# Patient Record
Sex: Female | Born: 1970 | Race: Black or African American | Hispanic: No | Smoking: Never smoker
Health system: Southern US, Community
[De-identification: ages and names within clinical notes are randomized; demographics above are authoritative.]

## PROBLEM LIST (undated history)

## (undated) DIAGNOSIS — O1490 Unspecified pre-eclampsia, unspecified trimester: Secondary | ICD-10-CM

## (undated) DIAGNOSIS — I10 Essential (primary) hypertension: Secondary | ICD-10-CM

## (undated) DIAGNOSIS — Z8632 Personal history of gestational diabetes: Secondary | ICD-10-CM

## (undated) DIAGNOSIS — E119 Type 2 diabetes mellitus without complications: Secondary | ICD-10-CM

## (undated) DIAGNOSIS — O24419 Gestational diabetes mellitus in pregnancy, unspecified control: Secondary | ICD-10-CM

## (undated) HISTORY — DX: Personal history of gestational diabetes: Z86.32

## (undated) HISTORY — DX: Type 2 diabetes mellitus without complications: E11.9

## (undated) HISTORY — DX: Unspecified pre-eclampsia, unspecified trimester: O14.90

## (undated) HISTORY — PX: TUBAL LIGATION: SHX77

## (undated) HISTORY — DX: Gestational diabetes mellitus in pregnancy, unspecified control: O24.419

---

## 1997-11-15 ENCOUNTER — Emergency Department (HOSPITAL_COMMUNITY): Admission: EM | Admit: 1997-11-15 | Discharge: 1997-11-15 | Payer: Self-pay

## 1998-11-24 ENCOUNTER — Emergency Department (HOSPITAL_COMMUNITY): Admission: EM | Admit: 1998-11-24 | Discharge: 1998-11-24 | Payer: Self-pay | Admitting: Emergency Medicine

## 1999-02-01 ENCOUNTER — Inpatient Hospital Stay (HOSPITAL_COMMUNITY): Admission: AD | Admit: 1999-02-01 | Discharge: 1999-02-01 | Payer: Self-pay | Admitting: Obstetrics

## 1999-04-04 ENCOUNTER — Inpatient Hospital Stay (HOSPITAL_COMMUNITY): Admission: AD | Admit: 1999-04-04 | Discharge: 1999-04-04 | Payer: Self-pay | Admitting: Obstetrics

## 1999-07-24 ENCOUNTER — Inpatient Hospital Stay (HOSPITAL_COMMUNITY): Admission: AD | Admit: 1999-07-24 | Discharge: 1999-07-24 | Payer: Self-pay | Admitting: Obstetrics

## 1999-09-02 ENCOUNTER — Inpatient Hospital Stay (HOSPITAL_COMMUNITY): Admission: AD | Admit: 1999-09-02 | Discharge: 1999-09-04 | Payer: Self-pay | Admitting: Obstetrics

## 1999-11-30 ENCOUNTER — Emergency Department (HOSPITAL_COMMUNITY): Admission: EM | Admit: 1999-11-30 | Discharge: 1999-11-30 | Payer: Self-pay | Admitting: *Deleted

## 2001-03-28 ENCOUNTER — Ambulatory Visit (HOSPITAL_COMMUNITY): Admission: RE | Admit: 2001-03-28 | Discharge: 2001-03-28 | Payer: Self-pay | Admitting: *Deleted

## 2001-08-14 ENCOUNTER — Emergency Department (HOSPITAL_COMMUNITY): Admission: EM | Admit: 2001-08-14 | Discharge: 2001-08-14 | Payer: Self-pay

## 2001-12-26 ENCOUNTER — Other Ambulatory Visit: Admission: RE | Admit: 2001-12-26 | Discharge: 2001-12-26 | Payer: Self-pay | Admitting: Obstetrics and Gynecology

## 2002-10-15 ENCOUNTER — Other Ambulatory Visit: Admission: RE | Admit: 2002-10-15 | Discharge: 2002-10-15 | Payer: Self-pay | Admitting: Obstetrics and Gynecology

## 2002-12-07 ENCOUNTER — Ambulatory Visit (HOSPITAL_COMMUNITY): Admission: RE | Admit: 2002-12-07 | Discharge: 2002-12-07 | Payer: Self-pay | Admitting: Obstetrics and Gynecology

## 2002-12-07 ENCOUNTER — Encounter: Payer: Self-pay | Admitting: Obstetrics and Gynecology

## 2003-01-05 ENCOUNTER — Ambulatory Visit (HOSPITAL_COMMUNITY): Admission: RE | Admit: 2003-01-05 | Discharge: 2003-01-05 | Payer: Self-pay | Admitting: Obstetrics and Gynecology

## 2003-01-05 ENCOUNTER — Encounter: Payer: Self-pay | Admitting: Obstetrics and Gynecology

## 2003-03-30 ENCOUNTER — Ambulatory Visit (HOSPITAL_COMMUNITY): Admission: RE | Admit: 2003-03-30 | Discharge: 2003-03-30 | Payer: Self-pay | Admitting: Obstetrics and Gynecology

## 2003-04-13 ENCOUNTER — Encounter: Admission: RE | Admit: 2003-04-13 | Discharge: 2003-04-13 | Payer: Self-pay | Admitting: Obstetrics and Gynecology

## 2003-04-19 ENCOUNTER — Inpatient Hospital Stay (HOSPITAL_COMMUNITY): Admission: AD | Admit: 2003-04-19 | Discharge: 2003-04-19 | Payer: Self-pay | Admitting: Obstetrics and Gynecology

## 2003-04-22 ENCOUNTER — Encounter: Admission: RE | Admit: 2003-04-22 | Discharge: 2003-04-22 | Payer: Self-pay | Admitting: Obstetrics and Gynecology

## 2003-04-23 ENCOUNTER — Inpatient Hospital Stay (HOSPITAL_COMMUNITY): Admission: RE | Admit: 2003-04-23 | Discharge: 2003-04-27 | Payer: Self-pay | Admitting: Obstetrics and Gynecology

## 2003-12-28 ENCOUNTER — Other Ambulatory Visit: Admission: RE | Admit: 2003-12-28 | Discharge: 2003-12-28 | Payer: Self-pay | Admitting: Obstetrics and Gynecology

## 2004-01-24 ENCOUNTER — Ambulatory Visit: Payer: Self-pay | Admitting: Family Medicine

## 2004-05-10 ENCOUNTER — Emergency Department (HOSPITAL_COMMUNITY): Admission: EM | Admit: 2004-05-10 | Discharge: 2004-05-10 | Payer: Self-pay | Admitting: Emergency Medicine

## 2005-03-12 ENCOUNTER — Emergency Department (HOSPITAL_COMMUNITY): Admission: EM | Admit: 2005-03-12 | Discharge: 2005-03-12 | Payer: Self-pay | Admitting: *Deleted

## 2005-12-14 ENCOUNTER — Other Ambulatory Visit: Admission: RE | Admit: 2005-12-14 | Discharge: 2005-12-14 | Payer: Self-pay | Admitting: Obstetrics and Gynecology

## 2012-10-14 ENCOUNTER — Emergency Department (HOSPITAL_COMMUNITY): Payer: Self-pay

## 2012-10-14 ENCOUNTER — Emergency Department (HOSPITAL_COMMUNITY)
Admission: EM | Admit: 2012-10-14 | Discharge: 2012-10-14 | Disposition: A | Discharge status: Home / Self Care | Payer: Self-pay | Attending: Emergency Medicine | Admitting: Emergency Medicine

## 2012-10-14 ENCOUNTER — Encounter (HOSPITAL_COMMUNITY): Payer: Self-pay | Admitting: Emergency Medicine

## 2012-10-14 DIAGNOSIS — I10 Essential (primary) hypertension: Secondary | ICD-10-CM | POA: Insufficient documentation

## 2012-10-14 DIAGNOSIS — Z79899 Other long term (current) drug therapy: Secondary | ICD-10-CM | POA: Insufficient documentation

## 2012-10-14 DIAGNOSIS — R5383 Other fatigue: Secondary | ICD-10-CM | POA: Insufficient documentation

## 2012-10-14 DIAGNOSIS — R42 Dizziness and giddiness: Secondary | ICD-10-CM | POA: Insufficient documentation

## 2012-10-14 DIAGNOSIS — R5381 Other malaise: Secondary | ICD-10-CM | POA: Insufficient documentation

## 2012-10-14 DIAGNOSIS — E876 Hypokalemia: Secondary | ICD-10-CM | POA: Insufficient documentation

## 2012-10-14 HISTORY — DX: Essential (primary) hypertension: I10

## 2012-10-14 LAB — CBC WITH DIFFERENTIAL/PLATELET
Basophils Absolute: 0 10*3/uL (ref 0.0–0.1)
Basophils Relative: 1 % (ref 0–1)
Eosinophils Absolute: 0.2 10*3/uL (ref 0.0–0.7)
Eosinophils Relative: 4 % (ref 0–5)
HCT: 39.6 % (ref 36.0–46.0)
Hemoglobin: 13.4 g/dL (ref 12.0–15.0)
Lymphocytes Relative: 38 % (ref 12–46)
Lymphs Abs: 2.4 10*3/uL (ref 0.7–4.0)
MCH: 27.2 pg (ref 26.0–34.0)
MCHC: 33.8 g/dL (ref 30.0–36.0)
MCV: 80.3 fL (ref 78.0–100.0)
Monocytes Absolute: 0.4 10*3/uL (ref 0.1–1.0)
Monocytes Relative: 7 % (ref 3–12)
Neutro Abs: 3.2 10*3/uL (ref 1.7–7.7)
Neutrophils Relative %: 51 % (ref 43–77)
Platelets: 361 10*3/uL (ref 150–400)
RBC: 4.93 MIL/uL (ref 3.87–5.11)
RDW: 13.2 % (ref 11.5–15.5)
WBC: 6.2 10*3/uL (ref 4.0–10.5)

## 2012-10-14 LAB — COMPREHENSIVE METABOLIC PANEL
ALT: 16 U/L (ref 0–35)
AST: 16 U/L (ref 0–37)
Albumin: 4 g/dL (ref 3.5–5.2)
Alkaline Phosphatase: 52 U/L (ref 39–117)
BUN: 10 mg/dL (ref 6–23)
CO2: 26 mEq/L (ref 19–32)
Calcium: 9.5 mg/dL (ref 8.4–10.5)
Chloride: 100 mEq/L (ref 96–112)
Creatinine, Ser: 0.58 mg/dL (ref 0.50–1.10)
GFR calc Af Amer: >90 mL/min (ref >90)
GFR calc non Af Amer: >90 mL/min (ref >90)
Glucose, Bld: 110 mg/dL — ABNORMAL HIGH (ref 70–99)
Potassium: 3 mEq/L — ABNORMAL LOW (ref 3.5–5.1)
Sodium: 136 mEq/L (ref 135–145)
Total Bilirubin: 0.3 mg/dL (ref 0.3–1.2)
Total Protein: 8.1 g/dL (ref 6.0–8.3)

## 2012-10-14 LAB — POCT I-STAT TROPONIN I: Troponin i, poc: 0.01 ng/mL (ref 0.00–0.08)

## 2012-10-14 MED ORDER — POTASSIUM CHLORIDE ER 10 MEQ PO TBCR: EXTENDED_RELEASE_TABLET | ORAL | Status: DC

## 2012-10-14 NOTE — ED Provider Notes (Signed)
History     CSN: [id number]  Arrival date & time 10/14/12  4098   First MD Initiated Contact with Patient 10/14/12 [phone]      Chief Complaint  Patient presents with  . Chest Pain  . Dizziness    (Consider location/radiation/quality/duration/timing/severity/associated sxs/prior treatment) Patient is a 42 y.o. female presenting with weakness. The history is provided by the patient (the pt complain of weakness). No language interpreter was used.  Weakness This is a new problem. The current episode started more than 1 week ago. The problem occurs constantly. The problem has not changed since onset.Pertinent negatives include no chest pain, no abdominal pain and no headaches. Nothing aggravates the symptoms. Nothing relieves the symptoms.    Past Medical History  Diagnosis Date  . Hypertension     Past Surgical History  Procedure Laterality Date  . Cesarean section    . Tubal ligation      No family history on file.  History  Substance Use Topics  . Smoking status: Never Smoker   . Smokeless tobacco: Not on file  . Alcohol Use: No    OB History   Grav Para Term Preterm Abortions TAB SAB Ect Mult Living                  Review of Systems  Constitutional: Negative for appetite change and fatigue.  HENT: Negative for congestion, sinus pressure and ear discharge.   Eyes: Negative for discharge.  Respiratory: Negative for cough.   Cardiovascular: Negative for chest pain.  Gastrointestinal: Negative for abdominal pain and diarrhea.  Genitourinary: Negative for frequency and hematuria.  Musculoskeletal: Negative for back pain.  Skin: Negative for rash.  Neurological: Positive for weakness. Negative for seizures and headaches.  Psychiatric/Behavioral: Negative for hallucinations.    Allergies  Review of patient's allergies indicates no known allergies.  Home Medications   Current Outpatient Rx  Name  Route  Sig  Dispense  Refill  . amLODipine (NORVASC) 10 MG  tablet   Oral   Take 10 mg by mouth every morning.         . hydrochlorothiazide (HYDRODIURIL) 25 MG tablet   Oral   Take 25 mg by mouth every morning.         . potassium chloride (K-DUR) 10 MEQ tablet      Take one tablet daily   15 tablet   0     BP 140/92  Pulse 104  Temp(Src) 98.4 F (36.9 C) (Oral)  Resp 22  SpO2 97%  LMP 10/01/2012  Physical Exam  Constitutional: She is oriented to person, place, and time. She appears well-developed.  HENT:  Head: Normocephalic.  Eyes: Conjunctivae and EOM are normal. No scleral icterus.  Neck: Neck supple. No thyromegaly present.  Cardiovascular: Normal rate and regular rhythm.  Exam reveals no gallop and no friction rub.   No murmur heard. Pulmonary/Chest: No stridor. She has no wheezes. She has no rales. She exhibits no tenderness.  Abdominal: She exhibits no distension. There is no tenderness. There is no rebound.  Musculoskeletal: Normal range of motion. She exhibits no edema.  Lymphadenopathy:    She has no cervical adenopathy.  Neurological: She is oriented to person, place, and time. Coordination normal.  Skin: No rash noted. No erythema.  Psychiatric: She has a normal mood and affect. Her behavior is normal.    ED Course  Procedures (including critical care time)  Labs Reviewed  COMPREHENSIVE METABOLIC PANEL - Abnormal; Notable for the  following:    Potassium 3.0 (*)    Glucose, Bld 110 (*)    All other components within normal limits  CBC WITH DIFFERENTIAL  POCT I-STAT TROPONIN I   Dg Chest 2 View  10/14/2012   *RADIOLOGY REPORT*  Clinical Data: Chest pain and shortness of breath  CHEST - 2 VIEW  Comparison:  March 12, 2005  Findings: Lungs clear.  Heart size and pulmonary vascularity are normal.  No pneumothorax.  No adenopathy.  No bone lesions.  IMPRESSION: No abnormality noted.   Original Report Authenticated By: Bretta Bang, M.D.   Ct Head Wo Contrast  10/14/2012   *RADIOLOGY REPORT*  Clinical  Data: Chest pain, dizziness, headache  CT HEAD WITHOUT CONTRAST  Technique:  Contiguous axial images were obtained from the base of the skull through the vertex without contrast.  Comparison: None.  Findings: No intracranial hemorrhage, mass effect or midline shift. The paranasal sinuses and mastoid air cells are unremarkable.  No skull fracture.  No hydrocephalus.  The gray and white matter differentiation is preserved. No acute infarction.  No mass lesion is noted on this unenhanced scan. No intra or extra-axial fluid collection.  IMPRESSION: No acute intracranial abnormality.   Original Report Authenticated By: Natasha Mead, M.D.     1. Hypokalemia      Date: 10/14/2012  Rate: 105  Rhythm: normal sinus rhythm  QRS Axis: normal  Intervals: normal  ST/T Wave abnormalities: normal  Conduction Disutrbances:none  Narrative Interpretation:   Old EKG Reviewed: none available    MDM  Will tx hypokalemia and have pt follow up         Benny Lennert, MD 10/14/12 1046

## 2012-10-14 NOTE — Progress Notes (Signed)
[facility] has seen patient and provided her with a oc app and a list of primary care resources.

## 2012-10-14 NOTE — ED Notes (Signed)
Pt states that for a little longer than a week she has been having this feeling of pressure n her head along with dizziness. Also c/o left sided chest pain that comes and goes.

## 2013-01-14 ENCOUNTER — Encounter: Payer: Self-pay | Admitting: Family Medicine

## 2013-01-14 ENCOUNTER — Ambulatory Visit (INDEPENDENT_AMBULATORY_CARE_PROVIDER_SITE_OTHER): Payer: 59 | Admitting: Family Medicine

## 2013-01-14 VITALS — BP 150/100 | HR 82 | Temp 98.3°F | Ht 63.25 in | Wt 295.0 lb

## 2013-01-14 DIAGNOSIS — I10 Essential (primary) hypertension: Secondary | ICD-10-CM

## 2013-01-14 MED ORDER — AMLODIPINE BESYLATE 10 MG PO TABS: 10.0000 mg | ORAL_TABLET | Freq: Every morning | ORAL | Status: DC

## 2013-01-14 MED ORDER — HYDROCHLOROTHIAZIDE 25 MG PO TABS: 25.0000 mg | ORAL_TABLET | Freq: Every morning | ORAL | Status: DC

## 2013-01-14 NOTE — Patient Instructions (Addendum)
It was nice to meet you today. I will send your medications to [facility].  Please make an appointment to come back in one month. Please come for an early morning appt before breakfast so we can check your cholesterol.   Arvilla Salada M. Dariela Stoker, M.D.

## 2013-01-14 NOTE — Assessment & Plan Note (Signed)
Long standing history, currently untreated. Will restart medications (Norvasc and HCTZ). F/u in 1 month after starting medications. Will check fasting labs at that time as well. RTC for worsening ha, fatigue, chest pain or changes in vision. Patient agrees with plan.

## 2013-01-14 NOTE — Progress Notes (Signed)
Patient ID: Stacey Robbins, female   DOB: 07-26-1970, 42 y.o.   MRN: [id number]  [facility] Stacey Ollis M. Ghassan Coggeshall, MD Phone: [phone]   Subjective: HPI: Patient is a 42 y.o. female presenting to clinic today for new patient appointment. Previously seen at [facility] clinic. Concerns today include HTN  1. Hypertension Blood pressure at home: Does not check Blood pressure today:  150/100 Taking Meds: No. Previously on Norvasc and HCTZ. BP well controlled at that time. ROS: Denies visual changes, nausea, vomiting, chest pain, abdominal pain. Endorses HA, SOB and leg edema  Health Maintenance:  Last pap smear - 2005 Mammogram - Will need after age 16 Flu shot - Never Tdap - 2008  Last labs - BMet and CBC in June 2014  Past Medical History  Diagnosis Date  . Hypertension   . Gestational diabetes   . Preeclampsia    Past Surgical History  Procedure Laterality Date  . Cesarean section      2004  . Tubal ligation      2004   History   Social History  . Marital Status: Married    Spouse Name: Cristal Deer    Number of Children: 4  . Years of Education: N/A   Occupational History  . Social research officer, government   Social History Main Topics  . Smoking status: Never Smoker   . Smokeless tobacco: Not on file  . Alcohol Use: No  . Drug Use: No  . Sexual Activity: Yes    Birth Control/ Protection: Surgical   Other Topics Concern  . Not on file   Social History Narrative   Married (husband, Cristal Deer) with 4 daughters, 3 are in college. Youngest daughter is 32.    Works at [employer] for 12 years   Graduates Sept 2014 with BS, plans to start [facility] [employer] program in Oct 2014.            ROS: Please see HPI above.  Objective: Office vital signs reviewed. BP 150/100  Pulse 82  Temp(Src) 98.3 F (36.8 C) (Oral)  Ht 5' 3.25" (1.607 m)  Wt 295 lb (133.811 kg)  BMI 51.82 kg/m2  LMP 12/17/2012  Physical  Examination:  General: Awake, alert. NAD. Husband with her in exam room HEENT: Atraumatic, normocephalic. MMM. Pupils equal and reactiv Neck: No masses palpated. No LAD Pulm: CTAB, no wheezes Cardio: RRR, 1/6 systolic murmur appreciated Abdomen: Obese,+BS, soft, nontender, nondistended Extremities: Trace pitting edema Neuro: Grossly intact  Assessment: 42 y.o. female new patient with HTN  Plan: See Problem List and After Visit Summary

## 2013-02-13 ENCOUNTER — Other Ambulatory Visit: Payer: 59

## 2013-02-23 ENCOUNTER — Ambulatory Visit: Payer: 59 | Admitting: Family Medicine

## 2013-03-04 ENCOUNTER — Ambulatory Visit: Payer: 59 | Admitting: Family Medicine

## 2013-04-09 ENCOUNTER — Other Ambulatory Visit: Payer: Self-pay

## 2013-04-09 DIAGNOSIS — Z1231 Encounter for screening mammogram for malignant neoplasm of breast: Secondary | ICD-10-CM

## 2013-05-15 ENCOUNTER — Ambulatory Visit
Admission: RE | Admit: 2013-05-15 | Discharge: 2013-05-15 | Disposition: A | Discharge status: Home / Self Care | Payer: 59 | Source: Ambulatory Visit

## 2013-05-15 DIAGNOSIS — Z1231 Encounter for screening mammogram for malignant neoplasm of breast: Secondary | ICD-10-CM

## 2013-05-19 ENCOUNTER — Other Ambulatory Visit: Payer: Self-pay | Admitting: Family Medicine

## 2013-05-19 DIAGNOSIS — R928 Other abnormal and inconclusive findings on diagnostic imaging of breast: Secondary | ICD-10-CM

## 2013-05-28 ENCOUNTER — Ambulatory Visit
Admission: RE | Admit: 2013-05-28 | Discharge: 2013-05-28 | Disposition: A | Discharge status: Home / Self Care | Payer: 59 | Source: Ambulatory Visit | Attending: *Deleted | Admitting: *Deleted

## 2013-05-28 DIAGNOSIS — R928 Other abnormal and inconclusive findings on diagnostic imaging of breast: Secondary | ICD-10-CM

## 2013-06-15 ENCOUNTER — Ambulatory Visit (INDEPENDENT_AMBULATORY_CARE_PROVIDER_SITE_OTHER): Payer: 59 | Admitting: Family Medicine

## 2013-06-15 ENCOUNTER — Encounter: Payer: Self-pay | Admitting: Family Medicine

## 2013-06-15 VITALS — BP 140/88 | HR 102 | Temp 99.0°F | Wt 299.0 lb

## 2013-06-15 DIAGNOSIS — I10 Essential (primary) hypertension: Secondary | ICD-10-CM

## 2013-06-15 LAB — BASIC METABOLIC PANEL
BUN: 9 mg/dL (ref 6–23)
CO2: 29 mEq/L (ref 19–32)
Calcium: 9.3 mg/dL (ref 8.4–10.5)
Chloride: 101 mEq/L (ref 96–112)
Creat: 0.56 mg/dL (ref 0.50–1.10)
Glucose, Bld: 96 mg/dL (ref 70–99)
Potassium: 3.4 mEq/L — ABNORMAL LOW (ref 3.5–5.3)
Sodium: 138 mEq/L (ref 135–145)

## 2013-06-15 LAB — CBC
HCT: 38.2 % (ref 36.0–46.0)
Hemoglobin: 12.5 g/dL (ref 12.0–15.0)
MCH: 26.9 pg (ref 26.0–34.0)
MCHC: 32.7 g/dL (ref 30.0–36.0)
MCV: 82.3 fL (ref 78.0–100.0)
Platelets: 348 10*3/uL (ref 150–400)
RBC: 4.64 MIL/uL (ref 3.87–5.11)
RDW: 13.9 % (ref 11.5–15.5)
WBC: 6.8 10*3/uL (ref 4.0–10.5)

## 2013-06-15 LAB — LIPID PANEL
Cholesterol: 198 mg/dL (ref 0–200)
HDL: 52 mg/dL (ref >39)
LDL Cholesterol: 123 mg/dL — ABNORMAL HIGH (ref 0–99)
Total CHOL/HDL Ratio: 3.8 Ratio
Triglycerides: 113 mg/dL (ref <150)
VLDL: 23 mg/dL (ref 0–40)

## 2013-06-15 MED ORDER — HYDROCHLOROTHIAZIDE 25 MG PO TABS: 25.0000 mg | ORAL_TABLET | Freq: Every morning | ORAL | Status: DC

## 2013-06-15 MED ORDER — AMLODIPINE BESYLATE 10 MG PO TABS: 10.0000 mg | ORAL_TABLET | Freq: Every morning | ORAL | Status: DC

## 2013-06-15 NOTE — Progress Notes (Signed)
Patient ID: Stacey Robbins, female   DOB: [birth year], 43 y.o.   MRN: [id number]    Subjective: HPI: Patient is a 43 y.o. female presenting to clinic today for blood pressure follow up.  1. Hypertension Blood pressure at home: Does not check Blood pressure today: 140/88 Taking Meds: HCTZ 25mg , Norvasc 10mg  Side effects: None ROS: Endoreses occassional headache with visual changes. Denies nausea, vomiting, chest pain, abdominal pain or shortness of breath. Working out 30 minutes, 5 days per week. Feeling much better. Needs fasting lab work today  Health maintenance: [place] 05/15/13 - Abnormal, f/u 6 months Labs today No flu shot Tdap 2008 Pap 2005  History Reviewed: Never smoker.  ROS: Please see HPI above.  Objective: Office vital signs reviewed. BP 140/88  Pulse 102  Temp(Src) 99 F (37.2 C)  Wt 299 lb (135.626 kg)  LMP 05/26/2013  Physical Examination:  General: Awake, alert. NAD HEENT: Atraumatic, normocephalic Neck: No masses palpated. No LAD Pulm: CTAB, no wheezes Cardio: RRR, no murmurs appreciated Abdomen: obese, +BS, soft, nontender, nondistended Extremities: No edema Neuro: Grossly intact  Assessment: 43 y.o. female HTN follow up  Plan: See Problem List and After Visit Summary

## 2013-06-15 NOTE — Patient Instructions (Signed)
Congratulations on your exercising! I am jealous of your routine. Let me know if I can help you with anything.  I will call you with any abnormal results.  I will see you back in June, or otherwise please come back in about 6 months for a check up. You will need a pap smear this year.  Eswin Worrell M. Marshall Kampf, M.D.

## 2013-06-16 NOTE — Assessment & Plan Note (Signed)
BP at goal. Con't current regimen. Check labs today. Refills sent.  Will get her mammo scheduled.  Encouraged her to make pap appointment.

## 2013-06-24 ENCOUNTER — Other Ambulatory Visit: Payer: Self-pay | Admitting: Family Medicine

## 2013-06-24 MED ORDER — ATORVASTATIN CALCIUM 40 MG PO TABS: 40.0000 mg | ORAL_TABLET | Freq: Every day | ORAL | Status: DC

## 2013-12-11 ENCOUNTER — Other Ambulatory Visit: Payer: Self-pay | Admitting: Family Medicine

## 2013-12-11 ENCOUNTER — Other Ambulatory Visit: Payer: Self-pay

## 2013-12-11 ENCOUNTER — Other Ambulatory Visit: Payer: Self-pay | Admitting: Obstetrics and Gynecology

## 2013-12-11 DIAGNOSIS — R921 Mammographic calcification found on diagnostic imaging of breast: Secondary | ICD-10-CM

## 2013-12-15 ENCOUNTER — Other Ambulatory Visit: Payer: Self-pay | Admitting: Obstetrics and Gynecology

## 2013-12-15 DIAGNOSIS — R921 Mammographic calcification found on diagnostic imaging of breast: Secondary | ICD-10-CM

## 2013-12-17 ENCOUNTER — Ambulatory Visit
Admission: RE | Admit: 2013-12-17 | Discharge: 2013-12-17 | Disposition: A | Discharge status: Home / Self Care | Payer: 59 | Source: Ambulatory Visit

## 2013-12-17 DIAGNOSIS — R921 Mammographic calcification found on diagnostic imaging of breast: Secondary | ICD-10-CM

## 2013-12-18 ENCOUNTER — Ambulatory Visit: Payer: 59 | Admitting: Obstetrics and Gynecology

## 2014-03-08 ENCOUNTER — Encounter: Payer: Self-pay | Admitting: Family Medicine

## 2014-05-25 ENCOUNTER — Encounter: Payer: 59 | Admitting: Obstetrics and Gynecology

## 2014-06-17 ENCOUNTER — Ambulatory Visit (INDEPENDENT_AMBULATORY_CARE_PROVIDER_SITE_OTHER): Payer: 59 | Admitting: Obstetrics and Gynecology

## 2014-06-17 ENCOUNTER — Encounter: Payer: Self-pay | Admitting: Obstetrics and Gynecology

## 2014-06-17 VITALS — BP 132/85 | HR 88 | Temp 98.5°F | Ht 63.0 in | Wt 301.5 lb

## 2014-06-17 DIAGNOSIS — I10 Essential (primary) hypertension: Secondary | ICD-10-CM

## 2014-06-17 DIAGNOSIS — G44209 Tension-type headache, unspecified, not intractable: Secondary | ICD-10-CM | POA: Insufficient documentation

## 2014-06-17 DIAGNOSIS — G44211 Episodic tension-type headache, intractable: Secondary | ICD-10-CM

## 2014-06-17 DIAGNOSIS — Z713 Dietary counseling and surveillance: Secondary | ICD-10-CM

## 2014-06-17 DIAGNOSIS — G44201 Tension-type headache, unspecified, intractable: Secondary | ICD-10-CM | POA: Insufficient documentation

## 2014-06-17 MED ORDER — POTASSIUM CHLORIDE ER 10 MEQ PO TBCR: EXTENDED_RELEASE_TABLET | ORAL | Status: DC

## 2014-06-17 MED ORDER — HYDROCHLOROTHIAZIDE 25 MG PO TABS: 25.0000 mg | ORAL_TABLET | Freq: Every morning | ORAL | Status: DC

## 2014-06-17 MED ORDER — ATORVASTATIN CALCIUM 40 MG PO TABS: 40.0000 mg | ORAL_TABLET | Freq: Every day | ORAL | Status: DC

## 2014-06-17 MED ORDER — AMLODIPINE BESYLATE 10 MG PO TABS: 10.0000 mg | ORAL_TABLET | Freq: Every morning | ORAL | Status: DC

## 2014-06-17 NOTE — Assessment & Plan Note (Addendum)
A: BMI 53. Patient at preparation to change level in willingness to change level. Motivated. P: Patient given handout on diet, carb counting, and weight loss. Also patient given card for Dr. Kathaleen Grinder, RD. Encouraged healthy eating and exercise to help with weight loss. Will continue to monitor.

## 2014-06-17 NOTE — Assessment & Plan Note (Signed)
A: BP at goal on medication.  P: Continue current regimen. Refills sent to pharmacy.

## 2014-06-17 NOTE — Progress Notes (Signed)
Subjective: Chief Complaint  Patient presents with  . Annual Exam    HPI: Stacey Robbins is a 44 y.o. presenting to clinic today to discuss the following:  #Hypertension Blood pressure at home: 135/80 Blood pressure today: 132/85 Taking Meds: HCTZ and amlodipne Side effects: none ROS: Denies dizziness, visual changes, nausea, vomiting, chest pain, abdominal pain or shortness of breath.  #Headaches: patient states that she has been having a headache everyday for about 3 weeks. Located frontal and temporal regions.  Aleve helps it go away as does sleep. Patient believes headaches are due to stress as she has been having a stressful time in life with job and finances. Denies n/v, migraines, visual changes, photophobia or phonophobia.   #Swelling in ankles: Notice at end of day. High salt foods (ie pork) in diet but once she changed diet did not notice swelling. Improved with elevation.   #Weight loss: Asking about what she can do for weight change. Patient at the highest weight she has ever been. States she is ready for change. Does not want to have weight loss surgery but wants to do it the right way with diet and exercise. She states she is tired of carrying extra weight and she wants to be healthy for daughters.  Endorses stress eating. Not very active.   Health Maintenance: Due for Pap and flu.   All systems were reviewed and were negative unless otherwise noted in the HPI Past Medical, Surgical, Social, and Family History Reviewed & Updated per EMR.  Objective: BP 132/85 mmHg  Pulse 88  Temp(Src) 98.5 F (36.9 C) (Oral)  Ht 5\' 3"  (1.6 m)  Wt 301 lb 8 oz (136.76 kg)  BMI 53.42 kg/m2  LMP 05/22/2014  Physical Exam  Constitutional: She is oriented to person, place, and time and well-developed, well-nourished, and in no distress.  HENT:  Head: Normocephalic and atraumatic.  Nose: Nose normal.  Mouth/Throat: Oropharynx is clear and moist.  Eyes: Conjunctivae and EOM are  normal. Pupils are equal, round, and reactive to light.  Neck: Normal range of motion. Neck supple. No thyromegaly present.  Cardiovascular: Normal rate and regular rhythm.   Distant heart sounds due to body habitus.   Pulmonary/Chest: Effort normal and breath sounds normal. She has no wheezes. She has no rales.  Abdominal: Soft. Bowel sounds are normal. She exhibits no mass. There is no tenderness.  Musculoskeletal: Normal range of motion. She exhibits no edema.  Lymphadenopathy:    She has no cervical adenopathy.  Neurological: She is alert and oriented to person, place, and time. No cranial nerve deficit.  Skin: Skin is warm and dry. No rash noted.   PHQ-2 = 1  Assessment/Plan: Please see problem based Assessment and Plan  Health Maintainance: Offered flu shot to patient today, but pt declined. Patient to schedule Pap within the next 2 weeks.   Meds ordered this encounter  Medications  . amLODipine (NORVASC) 10 MG tablet    Sig: Take 1 tablet (10 mg total) by mouth every morning.    Dispense:  90 tablet    Refill:  3  . hydrochlorothiazide (HYDRODIURIL) 25 MG tablet    Sig: Take 1 tablet (25 mg total) by mouth every morning.    Dispense:  90 tablet    Refill:  3  . atorvastatin (LIPITOR) 40 MG tablet    Sig: Take 1 tablet (40 mg total) by mouth daily.    Dispense:  90 tablet    Refill:  3  . potassium chloride (K-DUR) 10 MEQ tablet    Sig: Take one tablet daily    Dispense:  30 tablet    Refill:  [place], DO 06/17/2014, 10:44 AM PGY-1, [place]

## 2014-06-17 NOTE — Assessment & Plan Note (Signed)
A: Tension headaches likely due to stress. Relieved with NSAID. No concerning red flags.  P: Patient encouraged to reduce stress. She should use NSAID prn for headaches.

## 2014-06-17 NOTE — Patient Instructions (Signed)
Schedule follow-up appointment for me to have a Pap smear. He declined the flu shot today, a few change her mind you can always come in the clinic just for the flu shot. Look into a gym membership or black girls run. I sent your medications to the pharmacy.   Exercise to Lose Weight Exercise and a healthy diet may help you lose weight. Your doctor may suggest specific exercises. EXERCISE IDEAS AND TIPS  Choose low-cost things you enjoy doing, such as walking, bicycling, or exercising to workout videos.  Take stairs instead of the elevator.  Walk during your lunch break.  Park your car further away from work or school.  Go to a gym or an exercise class.  Start with 5 to 10 minutes of exercise each day. Build up to 30 minutes of exercise 4 to 6 days a week.  Wear shoes with good support and comfortable clothes.  Stretch before and after working out.  Work out until you breathe harder and your heart beats faster.  Drink extra water when you exercise.  Do not do so much that you hurt yourself, feel dizzy, or get very short of breath. Exercises that burn about 150 calories:  Running 1  miles in 15 minutes.  Playing volleyball for 45 to 60 minutes.  Washing and waxing a car for 45 to 60 minutes.  Playing touch football for 45 minutes.  Walking 1  miles in 35 minutes.  Pushing a stroller 1  miles in 30 minutes.  Playing basketball for 30 minutes.  Raking leaves for 30 minutes.  Bicycling 5 miles in 30 minutes.  Walking 2 miles in 30 minutes.  Dancing for 30 minutes.  Shoveling snow for 15 minutes.  Swimming laps for 20 minutes.  Walking up stairs for 15 minutes.  Bicycling 4 miles in 15 minutes.  Gardening for 30 to 45 minutes.  Jumping rope for 15 minutes.  Washing windows or floors for 45 to 60 minutes. Document Released: 05/26/2010 Document Revised: 07/16/2011 Document Reviewed: 05/26/2010 [facility] Patient Information 2015 [place], [place]. This  information is not intended to replace advice given to you by your health care provider. Make sure you discuss any questions you have with your health care provider.    Diet Recommendations: Starchy (carb) foods include: Bread, rice, pasta, potatoes, corn, crackers, bagels, muffins, all baked goods.   Protein foods include: Meat, fish, poultry, eggs, dairy foods, and beans such as pinto and kidney beans (beans also provide carbohydrate).   1. Eat at least 3 meals and 1-2 snacks per day. Never go more than 4-5 hours while awake without eating.  2. Limit starchy foods to TWO per meal and ONE per snack. ONE portion of a starchy  food is equal to the following:   - ONE slice of bread (or its equivalent, such as half of a hamburger bun).   - 1/2 cup of a "scoopable" starchy food such as potatoes or rice.   - 1 OUNCE (28 grams) of starchy snack foods such as crackers or pretzels (look on label).   - 15 grams of carbohydrate as shown on food label.  3. Both lunch and dinner should include a protein food, a carb food, and vegetables.   - Obtain twice as many veg's as protein or carbohydrate foods for both lunch and dinner.   - Try to keep frozen veg's on hand for a quick vegetable serving.     - Fresh or frozen veg's are best.  4. Breakfast  should always include protein.    Calorie Counting for Weight Loss Calories are energy you get from the things you eat and drink. Your body uses this energy to keep you going throughout the day. The number of calories you eat affects your weight. When you eat more calories than your body needs, your body stores the extra calories as fat. When you eat fewer calories than your body needs, your body burns fat to get the energy it needs. Calorie counting means keeping track of how many calories you eat and drink each day. If you make sure to eat fewer calories than your body needs, you should lose weight. In order for calorie counting to work, you will need to eat the  number of calories that are right for you in a day to lose a healthy amount of weight per week. A healthy amount of weight to lose per week is usually 1-2 lb (0.5-0.9 kg). A dietitian can determine how many calories you need in a day and give you suggestions on how to reach your calorie goal.  WHAT IS MY MY PLAN? My goal is to have __________ calories per day.  If I have this many calories per day, I should lose around __________ pounds per week. WHAT DO I NEED TO KNOW ABOUT CALORIE COUNTING? In order to meet your daily calorie goal, you will need to:  Find out how many calories are in each food you would like to eat. Try to do this before you eat.  Decide how much of the food you can eat.  Write down what you ate and how many calories it had. Doing this is called keeping a food log. WHERE DO I FIND CALORIE INFORMATION? The number of calories in a food can be found on a Nutrition Facts label. Note that all the information on a label is based on a specific serving of the food. If a food does not have a Nutrition Facts label, try to look up the calories online or ask your dietitian for help. HOW DO I DECIDE HOW MUCH TO EAT? To decide how much of the food you can eat, you will need to consider both the number of calories in one serving and the size of one serving. This information can be found on the Nutrition Facts label. If a food does not have a Nutrition Facts label, look up the information online or ask your dietitian for help. Remember that calories are listed per serving. If you choose to have more than one serving of a food, you will have to multiply the calories per serving by the amount of servings you plan to eat. For example, the label on a package of bread might say that a serving size is 1 slice and that there are 90 calories in a serving. If you eat 1 slice, you will have eaten 90 calories. If you eat 2 slices, you will have eaten 180 calories. HOW DO I KEEP A FOOD LOG? After each meal,  record the following information in your food log:  What you ate.  How much of it you ate.  How many calories it had.  Then, add up your calories. Keep your food log near you, such as in a small notebook in your pocket. Another option is to use a mobile app or website. Some programs will calculate calories for you and show you how many calories you have left each time you add an item to the log. WHAT ARE SOME CALORIE  COUNTING TIPS?  Use your calories on foods and drinks that will fill you up and not leave you hungry. Some examples of this include foods like nuts and nut butters, vegetables, lean proteins, and high-fiber foods (more than 5 g fiber per serving).  Eat nutritious foods and avoid empty calories. Empty calories are calories you get from foods or beverages that do not have many nutrients, such as candy and soda. It is better to have a nutritious high-calorie food (such as an avocado) than a food with few nutrients (such as a bag of chips).  Know how many calories are in the foods you eat most often. This way, you do not have to look up how many calories they have each time you eat them.  Look out for foods that may seem like low-calorie foods but are really high-calorie foods, such as baked goods, soda, and fat-free candy.  Pay attention to calories in drinks. Drinks such as sodas, specialty coffee drinks, alcohol, and juices have a lot of calories yet do not fill you up. Choose low-calorie drinks like water and diet drinks.  Focus your calorie counting efforts on higher calorie items. Logging the calories in a garden salad that contains only vegetables is less important than calculating the calories in a milk shake.  Find a way of tracking calories that works for you. Get creative. Most people who are successful find ways to keep track of how much they eat in a day, even if they do not count every calorie. WHAT ARE SOME PORTION CONTROL TIPS?  Know how many calories are in a  serving. This will help you know how many servings of a certain food you can have.  Use a measuring cup to measure serving sizes. This is helpful when you start out. With time, you will be able to estimate serving sizes for some foods.  Take some time to put servings of different foods on your favorite plates, bowls, and cups so you know what a serving looks like.  Try not to eat straight from a bag or box. Doing this can lead to overeating. Put the amount you would like to eat in a cup or on a plate to make sure you are eating the right portion.  Use smaller plates, glasses, and bowls to prevent overeating. This is a quick and easy way to practice portion control. If your plate is smaller, less food can fit on it.  Try not to multitask while eating, such as watching TV or using your computer. If it is time to eat, sit down at a table and enjoy your food. Doing this will help you to start recognizing when you are full. It will also make you more aware of what and how much you are eating. HOW CAN I CALORIE COUNT WHEN EATING OUT?  Ask for smaller portion sizes or child-sized portions.  Consider sharing an entree and sides instead of getting your own entree.  If you get your own entree, eat only half. Ask for a box at the beginning of your meal and put the rest of your entree in it so you are not tempted to eat it.  Look for the calories on the menu. If calories are listed, choose the lower calorie options.  Choose dishes that include vegetables, fruits, whole grains, low-fat dairy products, and lean protein. Focusing on smart food choices from each of the 5 food groups can help you stay on track at restaurants.  Choose items that are  boiled, broiled, grilled, or steamed.  Choose water, milk, unsweetened iced tea, or other drinks without added sugars. If you want an alcoholic beverage, choose a lower calorie option. For example, a regular margarita can have up to 700 calories and a glass of  wine has around 150.  Stay away from items that are buttered, battered, fried, or served with cream sauce. Items labeled "crispy" are usually fried, unless stated otherwise.  Ask for dressings, sauces, and syrups on the side. These are usually very high in calories, so do not eat much of them.  Watch out for salads. Many people think salads are a healthy option, but this is often not the case. Many salads come with bacon, fried chicken, lots of cheese, fried chips, and dressing. All of these items have a lot of calories. If you want a salad, choose a garden salad and ask for grilled meats or steak. Ask for the dressing on the side, or ask for olive oil and vinegar or lemon to use as dressing.  Estimate how many servings of a food you are given. For example, a serving of cooked rice is  cup or about the size of half a tennis ball or one cupcake wrapper. Knowing serving sizes will help you be aware of how much food you are eating at restaurants. The list below tells you how big or small some common portion sizes are based on everyday objects.  1 oz--4 stacked dice.  3 oz--1 deck of cards.  1 tsp--1 dice.  1 Tbsp-- a Ping-Pong ball.  2 Tbsp--1 Ping-Pong ball.   cup--1 tennis ball or 1 cupcake wrapper.  1 cup--1 baseball. Document Released: 04/23/2005 Document Revised: 09/07/2013 Document Reviewed: 02/26/2013 [facility] Patient Information 2015 [place], [place]. This information is not intended to replace advice given to you by your health care provider. Make sure you discuss any questions you have with your health care provider.

## 2015-07-07 ENCOUNTER — Other Ambulatory Visit: Payer: Self-pay | Admitting: Obstetrics and Gynecology

## 2015-07-07 NOTE — Telephone Encounter (Signed)
Need refills on amlodipine,hctz and atorvastatin.  Send to [facility] on [address].

## 2015-07-08 ENCOUNTER — Encounter: Payer: 59 | Admitting: Obstetrics and Gynecology

## 2015-07-08 MED ORDER — ATORVASTATIN CALCIUM 40 MG PO TABS: 40.0000 mg | ORAL_TABLET | Freq: Every day | ORAL | Status: DC

## 2015-07-08 MED ORDER — AMLODIPINE BESYLATE 10 MG PO TABS: 10.0000 mg | ORAL_TABLET | Freq: Every morning | ORAL | Status: DC

## 2015-07-08 MED ORDER — HYDROCHLOROTHIAZIDE 25 MG PO TABS: 25.0000 mg | ORAL_TABLET | Freq: Every morning | ORAL | Status: DC

## 2015-09-02 ENCOUNTER — Ambulatory Visit: Payer: 59 | Admitting: Family Medicine

## 2015-12-09 ENCOUNTER — Encounter: Payer: Self-pay | Admitting: Obstetrics and Gynecology

## 2015-12-09 ENCOUNTER — Other Ambulatory Visit (HOSPITAL_COMMUNITY)
Admission: RE | Admit: 2015-12-09 | Discharge: 2015-12-09 | Disposition: A | Discharge status: Home / Self Care | Payer: 59 | Source: Ambulatory Visit | Attending: Family Medicine | Admitting: Family Medicine

## 2015-12-09 ENCOUNTER — Ambulatory Visit (INDEPENDENT_AMBULATORY_CARE_PROVIDER_SITE_OTHER): Payer: 59 | Admitting: Obstetrics and Gynecology

## 2015-12-09 VITALS — BP 144/70 | HR 95 | Temp 98.1°F | Ht 63.0 in | Wt 303.0 lb

## 2015-12-09 DIAGNOSIS — Z0000 Encounter for general adult medical examination without abnormal findings: Secondary | ICD-10-CM | POA: Diagnosis not present

## 2015-12-09 DIAGNOSIS — Z1151 Encounter for screening for human papillomavirus (HPV): Secondary | ICD-10-CM | POA: Diagnosis present

## 2015-12-09 DIAGNOSIS — Z113 Encounter for screening for infections with a predominantly sexual mode of transmission: Secondary | ICD-10-CM

## 2015-12-09 DIAGNOSIS — Z114 Encounter for screening for human immunodeficiency virus [HIV]: Secondary | ICD-10-CM

## 2015-12-09 DIAGNOSIS — Z01419 Encounter for gynecological examination (general) (routine) without abnormal findings: Secondary | ICD-10-CM | POA: Diagnosis present

## 2015-12-09 DIAGNOSIS — Z6841 Body Mass Index (BMI) 40.0 and over, adult: Secondary | ICD-10-CM

## 2015-12-09 DIAGNOSIS — Z124 Encounter for screening for malignant neoplasm of cervix: Secondary | ICD-10-CM | POA: Diagnosis not present

## 2015-12-09 DIAGNOSIS — N951 Menopausal and female climacteric states: Secondary | ICD-10-CM

## 2015-12-09 DIAGNOSIS — N760 Acute vaginitis: Secondary | ICD-10-CM | POA: Insufficient documentation

## 2015-12-09 DIAGNOSIS — I10 Essential (primary) hypertension: Secondary | ICD-10-CM

## 2015-12-09 DIAGNOSIS — I493 Ventricular premature depolarization: Secondary | ICD-10-CM

## 2015-12-09 DIAGNOSIS — Z1211 Encounter for screening for malignant neoplasm of colon: Secondary | ICD-10-CM | POA: Insufficient documentation

## 2015-12-09 LAB — COMPLETE METABOLIC PANEL WITH GFR
ALT: 17 U/L (ref 6–29)
AST: 14 U/L (ref 10–30)
Albumin: 4.1 g/dL (ref 3.6–5.1)
Alkaline Phosphatase: 60 U/L (ref 33–115)
BUN: 7 mg/dL (ref 7–25)
CO2: 27 mmol/L (ref 20–31)
Calcium: 9.4 mg/dL (ref 8.6–10.2)
Chloride: 102 mmol/L (ref 98–110)
Creat: 0.62 mg/dL (ref 0.50–1.10)
GFR, Est African American: >89 mL/min (ref >=60)
GFR, Est Non African American: >89 mL/min (ref >=60)
Glucose, Bld: 114 mg/dL — ABNORMAL HIGH (ref 65–99)
Potassium: 3.3 mmol/L — ABNORMAL LOW (ref 3.5–5.3)
Sodium: 135 mmol/L (ref 135–146)
Total Bilirubin: 0.6 mg/dL (ref 0.2–1.2)
Total Protein: 7.4 g/dL (ref 6.1–8.1)

## 2015-12-09 LAB — LIPID PANEL
Cholesterol: 142 mg/dL (ref 125–200)
HDL: 59 mg/dL (ref >=46)
LDL Cholesterol: 69 mg/dL (ref <130)
Total CHOL/HDL Ratio: 2.4 Ratio (ref <=5.0)
Triglycerides: 71 mg/dL (ref <150)
VLDL: 14 mg/dL (ref <30)

## 2015-12-09 MED ORDER — HYDROCHLOROTHIAZIDE 25 MG PO TABS: 25.0000 mg | ORAL_TABLET | Freq: Every morning | ORAL | 1 refills | Status: DC

## 2015-12-09 MED ORDER — AMLODIPINE BESYLATE 10 MG PO TABS: 10.0000 mg | ORAL_TABLET | Freq: Every morning | ORAL | 1 refills | Status: DC

## 2015-12-09 MED ORDER — ATORVASTATIN CALCIUM 40 MG PO TABS: 40.0000 mg | ORAL_TABLET | Freq: Every day | ORAL | 1 refills | Status: DC

## 2015-12-09 NOTE — Progress Notes (Signed)
Subjective: Chief Complaint  Patient presents with  . Annual Exam     HPI: Stacey Robbins is a 45 y.o. female who presents for well woman/preventative visit and annual GYN examination.  Acute Concerns:  1. Missed period. States she missed her period in June. Has usually been pretty regular. Normal menstruation last 5-6 days. Denies heavy bleeding. Denies spotting or irregular bleeding between periods. Endorses hot flashes, palpitations, mood changes.   2. Heart Flutter. States she feels like her heart skipped a beat. Feels it intermittently. No chest pain, dyspnea, radiation.   Diet: eats fruits and vegatable every day. 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 Junk food. Trying to bake more foods. Cooks most meals. Trys to refrain from salt.  Exercise: None. Was going to gym but stopped due to stress  Sexual History: Sexually active with husband of 25 years. Would like STD testing however.  Birth history: [id number]  LMP: Patient's last menstrual period was 11/26/2015 (exact date).  Birth Control: s/p BTL  POA/Living Will: No, would like information  Social:  Social History   Social History  . Marital status: Married    Spouse name: Harrell Gave  . Number of children: 4  . Years of education: N/A   Occupational History  . Teacher, music   Social History Main Topics  . Smoking status: Never Smoker  . Smokeless tobacco: None  . Alcohol use No  . Drug use: No  . Sexual activity: Yes    Birth control/ protection: Surgical   Other Topics Concern  . None   Social History Narrative   Married (husband, Harrell Gave) with 4 daughters, 3 are in college. Youngest daughter is 68.    Works at Web [employer] for 12 years   Graduates Sept 2014 with BS, plans to start [facility] program in Oct 2014.             Immunization:  Tdap/TD: Up to date  Influenza: Due in the fall  Cancer Screening:  Pap Smear: Due  Mammogram: Due  ROS  reviewed and were negative unless otherwise noted in HPI.    Past Medical, Surgical, Social, and Family History Reviewed & Updated per EMR. Smoking status - Never Smoker   Objective: BP (!) 144/70   Pulse 95   Temp 98.1 F (36.7 C) (Oral)   Ht 5\' 3"  (1.6 m)   Wt (!) 303 lb (137.4 kg)   LMP 11/26/2015 (Exact Date)   BMI 53.67 kg/m  Vitals and nursing notes reviewed  Physical Exam  Constitutional: She is oriented to person, place, and time and well-developed, well-nourished, and in no distress.  HENT:  Head: Normocephalic and atraumatic.  Mouth/Throat: Oropharynx is clear and moist.  Eyes: Conjunctivae and EOM are normal. Pupils are equal, round, and reactive to light.  Neck: Normal range of motion. Neck supple. No thyromegaly present.  Cardiovascular: Normal rate, regular rhythm and normal heart sounds.   Pulmonary/Chest: Effort normal and breath sounds normal.  Abdominal: Soft. Bowel sounds are normal.  Genitourinary: Vagina normal. Cervix exhibits no motion tenderness. No vaginal discharge found.  Genitourinary Comments: Cervix is friable   Musculoskeletal: Normal range of motion. She exhibits no edema or tenderness.  Neurological: She is alert and oriented to person, place, and time. She has normal motor skills and normal strength. No cranial nerve deficit. She exhibits normal muscle tone.  Skin: Skin is warm and dry. No rash noted.  Psychiatric: Affect normal. Her mood  appears anxious.    Assessment/Plan: 45 y.o. female presents for annual well woman/preventative exam and GYN exam. Please see problem specific assessment and plan.      Orders Placed This Encounter  Procedures  . HIV antibody (with reflex)  . COMPLETE METABOLIC PANEL WITH GFR  . Lipid panel    Meds ordered this encounter  Medications  . amLODipine (NORVASC) 10 MG tablet    Sig: Take 1 tablet (10 mg total) by mouth every morning.    Dispense:  90 tablet    Refill:  1  . atorvastatin (LIPITOR) 40  MG tablet    Sig: Take 1 tablet (40 mg total) by mouth daily.    Dispense:  90 tablet    Refill:  1  . hydrochlorothiazide (HYDRODIURIL) 25 MG tablet    Sig: Take 1 tablet (25 mg total) by mouth every morning.    Dispense:  90 tablet    Refill:  Jordan Hill, DO 12/09/2015, 9:17 AM PGY-3, [place]

## 2015-12-09 NOTE — Patient Instructions (Signed)
[facility] Patients Your primary care provider may refer you to a [facility] Consultant for a 15-30 minute visit. [facility] will focus on a particular problem.  After talking to you, the [facility] will help you make any changes you want to make centered around your health. [facility] can help you with: .             Difficult life problems .             Stress, depression or anxiety .             Coping with medical problems .             Reflect on harmful habits (alcohol, tobacco and drugs),  .             Learning relaxation skills  .             Sleep difficulties  .             Mental health concerns  Call [phone] to schedule an appointment   Health Maintenance, Female Adopting a healthy lifestyle and getting preventive care can go a long way to promote health and wellness. Talk with your health care provider about what schedule of regular examinations is right for you. This is a good chance for you to check in with your provider about disease prevention and staying healthy. In between checkups, there are plenty of things you can do on your own. Experts have done a lot of research about which lifestyle changes and preventive measures are most likely to keep you healthy. Ask your health care provider for more information. WEIGHT AND DIET  Eat a healthy diet  Be sure to include plenty of vegetables, fruits, low-fat dairy products, and lean protein.  Do not eat a lot of foods high in solid fats, added sugars, or salt.  Get regular exercise. This is one of the most important things you can do for your health.  Most adults should exercise for at least 150 minutes each week. The exercise should increase your heart rate and make you sweat (moderate-intensity exercise).  Most adults should also do strengthening exercises at least twice a week. This is in addition to the moderate-intensity exercise.  Maintain a healthy weight  Body mass index (BMI) is a  measurement that can be used to identify possible weight problems. It estimates body fat based on height and weight. Your health care provider can help determine your BMI and help you achieve or maintain a healthy weight.  For females 70 years of age and older:   A BMI below 18.5 is considered underweight.  A BMI of 18.5 to 24.9 is normal.  A BMI of 25 to 29.9 is considered overweight.  A BMI of 30 and above is considered obese.  Watch levels of cholesterol and blood lipids  You should start having your blood tested for lipids and cholesterol at 43 years of age, then have this test every 5 years.  You may need to have your cholesterol levels checked more often if:  Your lipid or cholesterol levels are high.  You are older than 43 years of age.  You are at high risk for heart disease.  CANCER SCREENING   Lung Cancer  Lung cancer screening is recommended for adults 92-43 years old who are at high risk for lung cancer because of a history of smoking.  A yearly low-dose CT scan of the lungs is  recommended for people who:  Currently smoke.  Have quit within the past 15 years.  Have at least a 30-pack-year history of smoking. A pack year is smoking an average of one pack of cigarettes a day for 1 year.  Yearly screening should continue until it has been 15 years since you quit.  Yearly screening should stop if you develop a health problem that would prevent you from having lung cancer treatment.  Breast Cancer  Practice breast self-awareness. This means understanding how your breasts normally appear and feel.  It also means doing regular breast self-exams. Let your health care provider know about any changes, no matter how small.  If you are in your 43 or 30s, you should have a clinical breast exam (CBE) by a health care provider every 1-3 years as part of a regular health exam.  If you are 43 or older, have a CBE every year. Also consider having a breast X-ray  (mammogram) every year.  If you have a family history of breast cancer, talk to your health care provider about genetic screening.  If you are at high risk for breast cancer, talk to your health care provider about having an MRI and a mammogram every year.  Breast cancer gene (BRCA) assessment is recommended for women who have family members with BRCA-related cancers. BRCA-related cancers include:  Breast.  Ovarian.  Tubal.  Peritoneal cancers.  Results of the assessment will determine the need for genetic counseling and BRCA1 and BRCA2 testing. Cervical Cancer Your health care provider may recommend that you be screened regularly for cancer of the pelvic organs (ovaries, uterus, and vagina). This screening involves a pelvic examination, including checking for microscopic changes to the surface of your cervix (Pap test). You may be encouraged to have this screening done every 3 years, beginning at age 43.  For women ages 43-65, health care providers may recommend pelvic exams and Pap testing every 3 years, or they may recommend the Pap and pelvic exam, combined with testing for human papilloma virus (HPV), every 5 years. Some types of HPV increase your risk of cervical cancer. Testing for HPV may also be done on women of any age with unclear Pap test results.  Other health care providers may not recommend any screening for nonpregnant women who are considered low risk for pelvic cancer and who do not have symptoms. Ask your health care provider if a screening pelvic exam is right for you.  If you have had past treatment for cervical cancer or a condition that could lead to cancer, you need Pap tests and screening for cancer for at least 20 years after your treatment. If Pap tests have been discontinued, your risk factors (such as having a new sexual partner) need to be reassessed to determine if screening should resume. Some women have medical problems that increase the chance of getting  cervical cancer. In these cases, your health care provider may recommend more frequent screening and Pap tests. Colorectal Cancer  This type of cancer can be detected and often prevented.  Routine colorectal cancer screening usually begins at 43 years of age and continues through 43 years of age.  Your health care provider may recommend screening at an earlier age if you have risk factors for colon cancer.  Your health care provider may also recommend using home test kits to check for hidden blood in the stool.  A small camera at the end of a tube can be used to examine your colon directly (  sigmoidoscopy or colonoscopy). This is done to check for the earliest forms of colorectal cancer.  Routine screening usually begins at age 38.  Direct examination of the colon should be repeated every 5-10 years through 43 years of age. However, you may need to be screened more often if early forms of precancerous polyps or small growths are found. Skin Cancer  Check your skin from head to toe regularly.  Tell your health care provider about any new moles or changes in moles, especially if there is a change in a mole's shape or color.  Also tell your health care provider if you have a mole that is larger than the size of a pencil eraser.  Always use sunscreen. Apply sunscreen liberally and repeatedly throughout the day.  Protect yourself by wearing long sleeves, pants, a wide-brimmed hat, and sunglasses whenever you are outside. HEART DISEASE, DIABETES, AND HIGH BLOOD PRESSURE   High blood pressure causes heart disease and increases the risk of stroke. High blood pressure is more likely to develop in:  People who have blood pressure in the high end of the normal range (130-139/85-89 mm Hg).  People who are overweight or obese.  People who are African American.  If you are 58-18 years of age, have your blood pressure checked every 3-5 years. If you are 47 years of age or older, have your blood  pressure checked every year. You should have your blood pressure measured twice--once when you are at a hospital or clinic, and once when you are not at a hospital or clinic. Record the average of the two measurements. To check your blood pressure when you are not at a hospital or clinic, you can use:  An automated blood pressure machine at a pharmacy.  A home blood pressure monitor.  If you are between 53 years and 26 years old, ask your health care provider if you should take aspirin to prevent strokes.  Have regular diabetes screenings. This involves taking a blood sample to check your fasting blood sugar level.  If you are at a normal weight and have a low risk for diabetes, have this test once every three years after 43 years of age.  If you are overweight and have a high risk for diabetes, consider being tested at a younger age or more often. PREVENTING INFECTION  Hepatitis B  If you have a higher risk for hepatitis B, you should be screened for this virus. You are considered at high risk for hepatitis B if:  You were born in a country where hepatitis B is common. Ask your health care provider which countries are considered high risk.  Your parents were born in a high-risk country, and you have not been immunized against hepatitis B (hepatitis B vaccine).  You have HIV or AIDS.  You use needles to inject street drugs.  You live with someone who has hepatitis B.  You have had sex with someone who has hepatitis B.  You get hemodialysis treatment.  You take certain medicines for conditions, including cancer, organ transplantation, and autoimmune conditions. Hepatitis C  Blood testing is recommended for:  Everyone born from [birth year] through 1963.  Anyone with known risk factors for hepatitis C. Sexually transmitted infections (STIs)  You should be screened for sexually transmitted infections (STIs) including gonorrhea and chlamydia if:  You are sexually active and are  younger than 43 years of age.  You are older than 43 years of age and your health care provider tells you  that you are at risk for this type of infection.  Your sexual activity has changed since you were last screened and you are at an increased risk for chlamydia or gonorrhea. Ask your health care provider if you are at risk.  If you do not have HIV, but are at risk, it may be recommended that you take a prescription medicine daily to prevent HIV infection. This is called pre-exposure prophylaxis (PrEP). You are considered at risk if:  You are sexually active and do not regularly use condoms or know the HIV status of your partner(s).  You take drugs by injection.  You are sexually active with a partner who has HIV. Talk with your health care provider about whether you are at high risk of being infected with HIV. If you choose to begin PrEP, you should first be tested for HIV. You should then be tested every 3 months for as long as you are taking PrEP.  PREGNANCY   If you are premenopausal and you may become pregnant, ask your health care provider about preconception counseling.  If you may become pregnant, take 400 to 800 micrograms (mcg) of folic acid every day.  If you want to prevent pregnancy, talk to your health care provider about birth control (contraception). OSTEOPOROSIS AND MENOPAUSE   Osteoporosis is a disease in which the bones lose minerals and strength with aging. This can result in serious bone fractures. Your risk for osteoporosis can be identified using a bone density scan.  If you are 28 years of age or older, or if you are at risk for osteoporosis and fractures, ask your health care provider if you should be screened.  Ask your health care provider whether you should take a calcium or vitamin D supplement to lower your risk for osteoporosis.  Menopause may have certain physical symptoms and risks.  Hormone replacement therapy may reduce some of these symptoms and  risks. Talk to your health care provider about whether hormone replacement therapy is right for you.  HOME CARE INSTRUCTIONS   Schedule regular health, dental, and eye exams.  Stay current with your immunizations.   Do not use any tobacco products including cigarettes, chewing tobacco, or electronic cigarettes.  If you are pregnant, do not drink alcohol.  If you are breastfeeding, limit how much and how often you drink alcohol.  Limit alcohol intake to no more than 1 drink per day for nonpregnant women. One drink equals 12 ounces of beer, 5 ounces of wine, or 1 ounces of hard liquor.  Do not use street drugs.  Do not share needles.  Ask your health care provider for help if you need support or information about quitting drugs.  Tell your health care provider if you often feel depressed.  Tell your health care provider if you have ever been abused or do not feel safe at home.   This information is not intended to replace advice given to you by your health care provider. Make sure you discuss any questions you have with your health care provider.   Document Released: 11/06/2010 Document Revised: 05/14/2014 Document Reviewed: 03/25/2013 Elsevier Interactive Patient Education [employer].

## 2015-12-10 LAB — HIV ANTIBODY (ROUTINE TESTING W REFLEX): HIV 1&2 Ab, 4th Generation: NONREACTIVE

## 2015-12-11 DIAGNOSIS — I493 Ventricular premature depolarization: Secondary | ICD-10-CM | POA: Insufficient documentation

## 2015-12-11 DIAGNOSIS — N951 Menopausal and female climacteric states: Secondary | ICD-10-CM | POA: Insufficient documentation

## 2015-12-11 NOTE — Assessment & Plan Note (Addendum)
Pap smear completed today HIV ordered along with other STD screenings Information given on scheduling mammogram Information given on living will Handout on routine healthcare maintenance for her age provided

## 2015-12-11 NOTE — Assessment & Plan Note (Signed)
Patient morbidly obese. Discussed lifestyle changes and exercise. Patient set goals to improve lifestyle. Information given for [facility] to discuss further goal setting.

## 2015-12-11 NOTE — Assessment & Plan Note (Signed)
BP mildly elevated. Patient has not taken medications yet today. No adjustment to regimen at this time. Blood work collected. Refills given.

## 2015-12-11 NOTE — Assessment & Plan Note (Signed)
History and symtpoms consistent with perimenopause. Patient reassured and advice given. No concern at this time for any red flags related to missed period. Irregular periods is common during this phase. Warning signs and return precautions given.

## 2015-12-11 NOTE — Assessment & Plan Note (Signed)
History provided by patient of skipped heart beats and fluttering intermittently sounds like PVCs. She is without chest pain during these events. On physical exam patient with RRR without murmur or skipped beats. Discussed benign nature of PVCs. Would obtain EKG at next visit; prior EKG reviewed and was normal.

## 2015-12-12 LAB — CERVICOVAGINAL ANCILLARY ONLY
Chlamydia: NEGATIVE
Neisseria Gonorrhea: NEGATIVE
Trichomonas: NEGATIVE

## 2015-12-13 ENCOUNTER — Other Ambulatory Visit: Payer: Self-pay | Admitting: Obstetrics and Gynecology

## 2015-12-13 LAB — CYTOLOGY - PAP

## 2015-12-13 MED ORDER — POTASSIUM CHLORIDE ER 20 MEQ PO TBCR: EXTENDED_RELEASE_TABLET | ORAL | 3 refills | Status: DC

## 2015-12-15 LAB — CERVICOVAGINAL ANCILLARY ONLY: Candida vaginitis: NEGATIVE

## 2016-04-24 ENCOUNTER — Other Ambulatory Visit: Payer: Self-pay | Admitting: Obstetrics and Gynecology

## 2016-04-24 NOTE — Telephone Encounter (Signed)
Pt needs a refill on potassium chloride. Pt uses [facility] on [address]. Please advise. Thanks! ep

## 2016-04-26 MED ORDER — POTASSIUM CHLORIDE ER 20 MEQ PO TBCR: EXTENDED_RELEASE_TABLET | ORAL | 1 refills | Status: DC

## 2016-04-26 NOTE — Telephone Encounter (Signed)
Please inform patient that she needs to come in for an appointment and have her potassium checked. Refill given with limited refills until patient can be seen.

## 2016-06-28 ENCOUNTER — Other Ambulatory Visit: Payer: Self-pay | Admitting: Obstetrics and Gynecology

## 2016-06-28 NOTE — Telephone Encounter (Signed)
Needs refill on potassium chloride , HCTZ, atorvastatin and amlodipine. [facility] on [address]. She would like a 90 day supply

## 2016-06-29 MED ORDER — POTASSIUM CHLORIDE ER 20 MEQ PO TBCR: EXTENDED_RELEASE_TABLET | ORAL | 1 refills | Status: DC

## 2016-06-29 MED ORDER — HYDROCHLOROTHIAZIDE 25 MG PO TABS: 25.0000 mg | ORAL_TABLET | Freq: Every morning | ORAL | 1 refills | Status: DC

## 2016-06-29 MED ORDER — ATORVASTATIN CALCIUM 40 MG PO TABS: 40.0000 mg | ORAL_TABLET | Freq: Every day | ORAL | 1 refills | Status: DC

## 2016-06-29 MED ORDER — AMLODIPINE BESYLATE 10 MG PO TABS: 10.0000 mg | ORAL_TABLET | Freq: Every morning | ORAL | 1 refills | Status: DC

## 2016-07-20 ENCOUNTER — Emergency Department (HOSPITAL_COMMUNITY)
Admission: EM | Admit: 2016-07-20 | Discharge: 2016-07-20 | Disposition: A | Discharge status: Home / Self Care | Payer: Self-pay | Attending: Emergency Medicine | Admitting: Emergency Medicine

## 2016-07-20 ENCOUNTER — Emergency Department (HOSPITAL_COMMUNITY): Payer: Self-pay

## 2016-07-20 ENCOUNTER — Encounter (HOSPITAL_COMMUNITY): Payer: Self-pay

## 2016-07-20 DIAGNOSIS — R079 Chest pain, unspecified: Secondary | ICD-10-CM

## 2016-07-20 DIAGNOSIS — R0789 Other chest pain: Secondary | ICD-10-CM | POA: Insufficient documentation

## 2016-07-20 DIAGNOSIS — I10 Essential (primary) hypertension: Secondary | ICD-10-CM | POA: Insufficient documentation

## 2016-07-20 DIAGNOSIS — Z79899 Other long term (current) drug therapy: Secondary | ICD-10-CM | POA: Insufficient documentation

## 2016-07-20 LAB — CBC
HCT: 36.9 % (ref 36.0–46.0)
Hemoglobin: 12.1 g/dL (ref 12.0–15.0)
MCH: 27 pg (ref 26.0–34.0)
MCHC: 32.8 g/dL (ref 30.0–36.0)
MCV: 82.4 fL (ref 78.0–100.0)
Platelets: 343 10*3/uL (ref 150–400)
RBC: 4.48 MIL/uL (ref 3.87–5.11)
RDW: 14.7 % (ref 11.5–15.5)
WBC: 5.9 10*3/uL (ref 4.0–10.5)

## 2016-07-20 LAB — BASIC METABOLIC PANEL
Anion gap: 10 (ref 5–15)
BUN: 9 mg/dL (ref 6–20)
CO2: 26 mmol/L (ref 22–32)
Calcium: 9.2 mg/dL (ref 8.9–10.3)
Chloride: 105 mmol/L (ref 101–111)
Creatinine, Ser: 0.52 mg/dL (ref 0.44–1.00)
GFR calc Af Amer: >60 mL/min (ref >60)
GFR calc non Af Amer: >60 mL/min (ref >60)
Glucose, Bld: 113 mg/dL — ABNORMAL HIGH (ref 65–99)
Potassium: 3.3 mmol/L — ABNORMAL LOW (ref 3.5–5.1)
Sodium: 141 mmol/L (ref 135–145)

## 2016-07-20 LAB — I-STAT TROPONIN, ED: Troponin i, poc: 0 ng/mL (ref 0.00–0.08)

## 2016-07-20 MED ORDER — KETOROLAC TROMETHAMINE 30 MG/ML IJ SOLN
30.0000 mg | Freq: Once | INTRAMUSCULAR | Status: AC
  Administered 2016-07-20: 30 mg via INTRAVENOUS
  Filled 2016-07-20: qty 1

## 2016-07-20 NOTE — ED Triage Notes (Signed)
Pt presents for evaluation of L breast and arm pain intermittently x 2-3 days. Pt reports some dizziness with the pain. Pt ambulatory. AxO x4.

## 2016-07-20 NOTE — ED Provider Notes (Signed)
[place] DEPT Provider Note   CSN: [id number] Arrival date & time: 07/20/16  0736     History   Chief Complaint Chief Complaint  Patient presents with  . Breast Pain    HPI Stacey Robbins is a 46 y.o. female.  Patient is a 46 year old female with a history of hypertension and hyperlipidemia who presents with left-sided chest pain. She states it's been intermittent over the last 2 days. She states it feels like a sharp pain that comes and goes. Only last a few seconds. She denies any chest pain currently. She denies any shortness of breath. She states at times she feels a little dizzy when the pain occurs. She denies any fevers. She's had a cough she says since October which is unchanged. She has baseline leg swelling which is unchanged. She denies a prior history of heart problems. She's a nonsmoker. She denies any family history of heart disease.      Past Medical History:  Diagnosis Date  . Gestational diabetes   . Hypertension   . Preeclampsia     Patient Active Problem List   Diagnosis Date Noted  . Perimenopausal 12/11/2015  . PVC (premature ventricular contraction) 12/11/2015  . Healthcare maintenance 12/09/2015  . BMI 50.0-59.9, adult ([place]) 12/09/2015  . Tension type headache 06/17/2014  . Weight loss counseling, encounter for 06/17/2014  . Essential hypertension, benign 01/14/2013    Past Surgical History:  Procedure Laterality Date  . CESAREAN SECTION     2004  . TUBAL LIGATION     2004    OB History    Gravida Para Term Preterm AB Living   6 5 5   1 4    SAB TAB Ectopic Multiple Live Births   1              Obstetric Comments   Son passed at 38 months old       Home Medications    Prior to Admission medications   Medication Sig Start Date End Date Taking? Authorizing Provider  amLODipine (NORVASC) 10 MG tablet Take 1 tablet (10 mg total) by mouth every morning. 06/29/16   Katheren Shams, DO  atorvastatin (LIPITOR) 40 MG tablet Take 1  tablet (40 mg total) by mouth daily. 06/29/16   Katheren Shams, DO  Garlic 378 MG CAPS Take 2 capsules by mouth.    Historical Provider, MD  hydrochlorothiazide (HYDRODIURIL) 25 MG tablet Take 1 tablet (25 mg total) by mouth every morning. 06/29/16   Katheren Shams, DO  Multiple Vitamins-Minerals (MULTIVITAMIN WITH MINERALS) tablet Take 1 tablet by mouth daily.    Historical Provider, MD  Potassium Chloride ER 20 MEQ TBCR Take one tablet daily 06/29/16   Katheren Shams, DO    Family History Family History  Problem Relation Age of Onset  . Diabetes Father   . Hypertension Father   . Hypertension Mother   . Breast cancer Maternal Aunt   . Cancer Maternal Aunt     breast cancer   . Brain cancer Maternal Uncle   . Prostate cancer Paternal Grandfather     Social History Social History  Substance Use Topics  . Smoking status: Never Smoker  . Smokeless tobacco: Not on file  . Alcohol use No     Allergies   Patient has no known allergies.   Review of Systems Review of Systems  Constitutional: Negative for chills, diaphoresis, fatigue and fever.  HENT: Negative for congestion, rhinorrhea and sneezing.  Eyes: Negative.   Respiratory: Positive for cough. Negative for chest tightness and shortness of breath.   Cardiovascular: Positive for chest pain. Negative for leg swelling.  Gastrointestinal: Negative for abdominal pain, blood in stool, diarrhea, nausea and vomiting.  Genitourinary: Negative for difficulty urinating, flank pain, frequency and hematuria.  Musculoskeletal: Negative for arthralgias and back pain.  Skin: Negative for rash.  Neurological: Negative for dizziness, speech difficulty, weakness, numbness and headaches.     Physical Exam Updated Vital Signs BP (!) 131/59   Pulse (!) 109   Temp 97.9 F (36.6 C) (Oral)   Resp (!) 21   Ht 5\' 3"  (1.6 m)   Wt 295 lb (133.8 kg)   LMP 07/10/2016 (Exact Date)   SpO2 99%   BMI 52.26 kg/m   Physical Exam    Constitutional: She is oriented to person, place, and time. She appears well-developed and well-nourished.  HENT:  Head: Normocephalic and atraumatic.  Eyes: Pupils are equal, round, and reactive to light.  Neck: Normal range of motion. Neck supple.  Cardiovascular: Normal rate, regular rhythm and normal heart sounds.   Pulmonary/Chest: Effort normal and breath sounds normal. No respiratory distress. She has no wheezes. She has no rales. She exhibits tenderness (Reproducible tenderness to left chest wall).  Abdominal: Soft. Bowel sounds are normal. There is no tenderness. There is no rebound and no guarding.  Musculoskeletal: Normal range of motion. She exhibits edema (1+ pain edema bilaterally).  Lymphadenopathy:    She has no cervical adenopathy.  Neurological: She is alert and oriented to person, place, and time.  Skin: Skin is warm and dry. No rash noted.  Psychiatric: She has a normal mood and affect.     ED Treatments / Results  Labs (all labs ordered are listed, but only abnormal results are displayed) Labs Reviewed  BASIC METABOLIC PANEL - Abnormal; Notable for the following:       Result Value   Potassium 3.3 (*)    Glucose, Bld 113 (*)    All other components within normal limits  CBC  I-STAT TROPOININ, ED    EKG  EKG Interpretation  Date/Time:  Friday July 20 2016 09:57:18 EDT Ventricular Rate:  102 PR Interval:    QRS Duration: 78 QT Interval:  331 QTC Calculation: 432 R Axis:   33 Text Interpretation:  Sinus tachycardia Low voltage, precordial leads since last tracing no significant change Confirmed by Pennye Beeghly  MD, Xan Ingraham ([id number]) on 07/20/2016 10:10:50 AM       Radiology Dg Chest 2 View  Result Date: 07/20/2016 CLINICAL DATA:  Left-sided breast and arm pain, initial encounter EXAM: CHEST  2 VIEW COMPARISON:  10/14/2012 FINDINGS: The heart size and mediastinal contours are within normal limits. Both lungs are clear. The visualized skeletal structures are  unremarkable. IMPRESSION: No active cardiopulmonary disease. Electronically Signed   By: Inez Catalina M.D.   On: 07/20/2016 09:08    Procedures Procedures (including critical care time)  Medications Ordered in ED Medications  ketorolac (TORADOL) 30 MG/ML injection 30 mg (30 mg Intravenous Given 07/20/16 0941)     Initial Impression / Assessment and Plan / ED Course  I have reviewed the triage vital signs and the nursing notes.  Pertinent labs & imaging results that were available during my care of the patient were reviewed by me and considered in my medical decision making (see chart for details).     Patient presents with atypical chest pain. It's reproducible on palpation. Her EKG doesn't  show ischemic changes. She has no persistent tachycardia. Her most recent heart rate was elevated but she seems to be very stressed out and was crying on exam during this evaluation. Prior to that her heart rate was in the 70s and 80s. Her chest x-ray is clear without suggestions of pneumonia or pneumothorax. She has no clinical suggestions of pulmonary embolus. She had a negative troponin. I encouraged her to stay and have a second troponin. However she's refusing this she says that she wants to go. I did advise her the limitations of our testing and that frequently the troponin can be elevated on the second tests. She not use this but does not want to stay. Her heart score is 2.  She was discharged home in good condition. She was encouraged to have close follow-up with her PCP. Return precautions were given.  Final Clinical Impressions(s) / ED Diagnoses   Final diagnoses:  Chest pain in adult    New Prescriptions New Prescriptions   No medications on file     Malvin Johns, MD 07/20/16 1012

## 2016-07-20 NOTE — ED Notes (Signed)
Patient transported to X-ray 

## 2016-07-20 NOTE — ED Notes (Signed)
Pt tearful, states she does not want to stay for 2nd troponin. Dr. Tamera Punt notified.

## 2016-12-14 ENCOUNTER — Encounter: Payer: Self-pay | Admitting: Family Medicine

## 2016-12-14 ENCOUNTER — Ambulatory Visit (INDEPENDENT_AMBULATORY_CARE_PROVIDER_SITE_OTHER): Payer: BLUE CROSS/BLUE SHIELD | Admitting: Family Medicine

## 2016-12-14 VITALS — BP 120/64 | HR 90 | Temp 98.6°F | Ht 63.0 in | Wt 299.0 lb

## 2016-12-14 DIAGNOSIS — Z713 Dietary counseling and surveillance: Secondary | ICD-10-CM

## 2016-12-14 DIAGNOSIS — I10 Essential (primary) hypertension: Secondary | ICD-10-CM | POA: Diagnosis not present

## 2016-12-14 DIAGNOSIS — R4589 Other symptoms and signs involving emotional state: Secondary | ICD-10-CM

## 2016-12-14 DIAGNOSIS — F329 Major depressive disorder, single episode, unspecified: Secondary | ICD-10-CM

## 2016-12-14 DIAGNOSIS — E782 Mixed hyperlipidemia: Secondary | ICD-10-CM | POA: Diagnosis not present

## 2016-12-14 MED ORDER — AMLODIPINE BESYLATE 10 MG PO TABS: 10.0000 mg | ORAL_TABLET | Freq: Every morning | ORAL | 1 refills | Status: DC

## 2016-12-14 MED ORDER — TETANUS-DIPHTH-ACELL PERTUSSIS 5-2.5-18.5 LF-MCG/0.5 IM SUSP: 0.5000 mL | Freq: Once | INTRAMUSCULAR | 0 refills | Status: AC

## 2016-12-14 MED ORDER — ATORVASTATIN CALCIUM 40 MG PO TABS: 40.0000 mg | ORAL_TABLET | Freq: Every day | ORAL | 1 refills | Status: DC

## 2016-12-14 MED ORDER — HYDROCHLOROTHIAZIDE 25 MG PO TABS: 25.0000 mg | ORAL_TABLET | Freq: Every morning | ORAL | 1 refills | Status: DC

## 2016-12-14 NOTE — Assessment & Plan Note (Signed)
  Unsuccessfully keeping weight off. Has tried calorie counting in past with success.  -referral to Dr. Jenne Campus for nutrition clinic to help with weight loss

## 2016-12-14 NOTE — Assessment & Plan Note (Signed)
  Likely situational as patient is going through separation from husband. PHQ 9 today is a 7 and "somewhat difficult". Interested in [facility] support, not interested in medications. Good support system from family/friends in area.  -[facility] to follow up with patient via phone -check TSH today

## 2016-12-14 NOTE — Assessment & Plan Note (Signed)
  Chronic, tolerating lipitor 40 mg well  -lipitor refilled -continue current regimen -check lipid panel today, patient is fasting

## 2016-12-14 NOTE — Assessment & Plan Note (Addendum)
  Chronic, well controlled on norvasc and HCTZ  -continue current regimen -medications refilled -check CMP today

## 2016-12-14 NOTE — Progress Notes (Signed)
    Subjective:    Patient ID: Stacey Robbins, female    DOB: August 26, 1970, 46 y.o.   MRN: [id number]   CC: physical exam  Going through separation with husband. She is tearful about this at times throughout interview. She as a good support system with close family members nearby. Unfortunately she still lives with husband. He is trying to reconcile but she is not interested. She endorses some symptoms of depression, no SI or HI. She is interested in speaking with [facility] and following up with them.   HTN -taking HCTZ and norvasc daily -taking potassium supplement -no CP, SOB, headaches, blurred vision  HLD -taking statin every evening  -fasting for labs today -denies myalgias  Obesity -is working on losing weight -lost 30 pounds successfully several months ago through portion control and cutting out sugary snacks/desserts -however she then gained 15 pounds back as she was stressed about impending divorce and stopped trying as hard -she is interested in nutrition clinic  Smoking status reviewed- non-smoker  Review of Systems- see HPI   Objective:  BP 120/64   Pulse 90   Temp 98.6 F (37 C) (Oral)   Ht 5\' 3"  (1.6 m)   Wt 299 lb (135.6 kg)   LMP 11/25/2016 (Exact Date)   SpO2 99%   BMI 52.97 kg/m  Vitals and nursing note reviewed  General: well nourished, in no acute distress HEENT: normocephalic, PERRLA, EOMI. no scleral icterus or conjunctival pallor, no nasal discharge, moist mucous membranes Neck: supple, non-tender, without lymphadenopathy Cardiac: RRR, clear S1 and S2, no murmurs, rubs, or gallops Respiratory: clear to auscultation bilaterally, no increased work of breathing Extremities: no edema or cyanosis. Skin: warm and dry, no rashes noted Neuro: alert and oriented, no focal deficits Psych: affect normal, mood is "down"  Assessment & Plan:    Depressed mood  Likely situational as patient is going through separation from husband. PHQ 9 today is a 7 and  "somewhat difficult". Interested in [facility] support, not interested in medications. Good support system from family/friends in area.  -[place] to follow up with patient via phone -check TSH today  Weight loss counseling, encounter for  Unsuccessfully keeping weight off. Has tried calorie counting in past with success.  -referral to Dr. Jenne Campus for nutrition clinic to help with weight loss  Essential hypertension, benign  Chronic, well controlled on norvasc and HCTZ  -continue current regimen -medications refilled -check CMP today  Mixed hyperlipidemia  Chronic, tolerating lipitor 40 mg well  -lipitor refilled -continue current regimen -check lipid panel today, patient is fasting    Return in about 1 year (around 12/14/2017), or as needed.   [name], DO Family Medicine Resident PGY-2

## 2016-12-14 NOTE — Patient Instructions (Addendum)
   It was great seeing you today!  I will call you with your lab results. If your potassium is too low I may increase your dose of potassium.  Please call Dr. Jenne Campus to get in to see her for nutrition clinic.  Someone from [facility] will give you a call to check in on you and provide follow up information.  If you have questions or concerns please do not hesitate to call at [phone].  [name], DO PGY-2, [facility] 12/14/2016 9:51 AM

## 2016-12-15 LAB — LIPID PANEL
Chol/HDL Ratio: 2.5 ratio (ref 0.0–4.4)
Cholesterol, Total: 161 mg/dL (ref 100–199)
HDL: 65 mg/dL (ref >39)
LDL Calculated: 80 mg/dL (ref 0–99)
Triglycerides: 78 mg/dL (ref 0–149)
VLDL Cholesterol Cal: 16 mg/dL (ref 5–40)

## 2016-12-15 LAB — CMP14+EGFR
ALT: 14 IU/L (ref 0–32)
AST: 13 IU/L (ref 0–40)
Albumin/Globulin Ratio: 1.5 (ref 1.2–2.2)
Albumin: 4.5 g/dL (ref 3.5–5.5)
Alkaline Phosphatase: 67 IU/L (ref 39–117)
BUN/Creatinine Ratio: 14 (ref 9–23)
BUN: 8 mg/dL (ref 6–24)
Bilirubin Total: 0.3 mg/dL (ref 0.0–1.2)
CO2: 25 mmol/L (ref 20–29)
Calcium: 9.8 mg/dL (ref 8.7–10.2)
Chloride: 101 mmol/L (ref 96–106)
Creatinine, Ser: 0.58 mg/dL (ref 0.57–1.00)
GFR calc Af Amer: 131 mL/min/{1.73_m2} (ref >59)
GFR calc non Af Amer: 113 mL/min/{1.73_m2} (ref >59)
Globulin, Total: 3 g/dL (ref 1.5–4.5)
Glucose: 108 mg/dL — ABNORMAL HIGH (ref 65–99)
Potassium: 3.8 mmol/L (ref 3.5–5.2)
Sodium: 142 mmol/L (ref 134–144)
Total Protein: 7.5 g/dL (ref 6.0–8.5)

## 2016-12-15 LAB — TSH: TSH: 0.926 u[IU]/mL (ref 0.450–4.500)

## 2016-12-17 ENCOUNTER — Other Ambulatory Visit (INDEPENDENT_AMBULATORY_CARE_PROVIDER_SITE_OTHER): Payer: BLUE CROSS/BLUE SHIELD

## 2016-12-17 ENCOUNTER — Telehealth: Payer: Self-pay | Admitting: Family Medicine

## 2016-12-17 DIAGNOSIS — R7301 Impaired fasting glucose: Secondary | ICD-10-CM | POA: Insufficient documentation

## 2016-12-17 LAB — POCT GLYCOSYLATED HEMOGLOBIN (HGB A1C): Hemoglobin A1C: 6

## 2016-12-17 MED ORDER — POTASSIUM CHLORIDE ER 20 MEQ PO TBCR: EXTENDED_RELEASE_TABLET | ORAL | 1 refills | Status: DC

## 2016-12-17 NOTE — Telephone Encounter (Signed)
Ms. Stacey Robbins A1C is 6.0 putting her in prediabetes range. Advised lifestyle modifications including diet, exercise, and weight loss. Patient verbalized understanding. Additionally, potassium supplement refilled.  Stacey Maine, DO PGY-2, [facility] 12/17/2016 3:36 PM

## 2016-12-17 NOTE — Telephone Encounter (Signed)
Informed Stacey Robbins of lab results. Lipid panel and TSH wnl. Would like to check A1C because fasting glucose elevated. [name] agreed to come by lab to get this done. No other questions/concerns.  [name], DO PGY-2, [facility] 12/17/2016 10:18 AM

## 2016-12-20 ENCOUNTER — Telehealth: Payer: Self-pay | Admitting: Licensed Clinical Social Worker

## 2016-12-20 NOTE — Progress Notes (Signed)
Integrated Care consult from Dr. Vanetta Shawl reference  F/u with patient due to family stressors.  Patient reports she is doing better right now.  She does not wish to make an appointment with [facility] at this time.  Intervention: emotional support, Reflective listening,  Plan: Patient will call [facility] and schedule an appointment if needed.  Casimer Lanius, LCSW Licensed Clinical Social Worker [facility]   [phone] 2:20 PM

## 2017-07-09 ENCOUNTER — Other Ambulatory Visit: Payer: Self-pay | Admitting: Family Medicine

## 2017-07-15 ENCOUNTER — Other Ambulatory Visit: Payer: Self-pay | Admitting: Family Medicine

## 2017-10-11 ENCOUNTER — Other Ambulatory Visit: Payer: Self-pay | Admitting: Family Medicine

## 2017-10-11 NOTE — Telephone Encounter (Signed)
Looks like patient is due for appt w Dr. Vanetta Shawl for recheck BP and med management. Will provide 1 month refills. Please help her schedule appt.

## 2017-10-14 NOTE — Telephone Encounter (Signed)
LM for patient to call back and schedule a BP follow up ok per DPR.  Haedyn Ancrum,CMA

## 2017-12-18 ENCOUNTER — Encounter: Payer: Self-pay | Admitting: Family Medicine

## 2017-12-18 ENCOUNTER — Ambulatory Visit (INDEPENDENT_AMBULATORY_CARE_PROVIDER_SITE_OTHER): Payer: BLUE CROSS/BLUE SHIELD | Admitting: Family Medicine

## 2017-12-18 ENCOUNTER — Other Ambulatory Visit: Payer: Self-pay

## 2017-12-18 VITALS — BP 105/70 | HR 105 | Temp 98.0°F | Wt 303.0 lb

## 2017-12-18 DIAGNOSIS — I10 Essential (primary) hypertension: Secondary | ICD-10-CM

## 2017-12-18 DIAGNOSIS — Z713 Dietary counseling and surveillance: Secondary | ICD-10-CM

## 2017-12-18 DIAGNOSIS — Z6841 Body Mass Index (BMI) 40.0 and over, adult: Secondary | ICD-10-CM | POA: Diagnosis not present

## 2017-12-18 DIAGNOSIS — Z113 Encounter for screening for infections with a predominantly sexual mode of transmission: Secondary | ICD-10-CM | POA: Diagnosis not present

## 2017-12-18 DIAGNOSIS — Z0000 Encounter for general adult medical examination without abnormal findings: Secondary | ICD-10-CM

## 2017-12-18 MED ORDER — LISINOPRIL 10 MG PO TABS: 10.0000 mg | ORAL_TABLET | Freq: Every day | ORAL | 0 refills | Status: DC

## 2017-12-18 MED ORDER — BUPROPION HCL 100 MG PO TABS: 100.0000 mg | ORAL_TABLET | Freq: Every day | ORAL | 0 refills | Status: DC

## 2017-12-18 NOTE — Progress Notes (Signed)
    Subjective:    Patient ID: Stacey Robbins, female    DOB: 24-Apr-1971, 47 y.o.   MRN: [id number]   CC: here for annual physical  Overall feels she is doing well.   Weight loss- She is struggling to lose weight. She reports she walks for exercise and is trying to watch what she eats but cannot seem to get the weight off. She is asking for medication to help jumpstart her weight loss.   HTN- blood pressure slightly low today. She denies feeling lightheaded or dizzy. She does endorse bilateral foot swelling worse on left than right. She is up and down a lot on her feet at work. Swelling worse at the end of the day. Denies calf swelling or tightness. Denies chest pain or SOB. Denies DOE.   Want to be screened for HIV and RPR today.   Smoking status reviewed- non-smoker  Review of Systems- see HPI.    Objective:  BP 105/70   Pulse (!) 105   Temp 98 F (36.7 C) (Oral)   Wt (!) 303 lb (137.4 kg)   LMP 10/19/2017 (LMP Unknown)   SpO2 97%   BMI 53.67 kg/m  Vitals and nursing note reviewed  General: pleasant obese female in NAD HEENT: normocephalic, MMM Neck: supple, non-tender, without lymphadenopathy or thyromegaly Cardiac: mildly tachycardic, regular rate, clear S1 and S2, no murmurs, rubs, or gallops Respiratory: clear to auscultation bilaterally, no increased work of breathing Extremities: trace edema of left foot > right, no calf tenderness bilaterally, calf size symmetrical, no erythema or warmth Skin: warm and dry, no rashes noted Neuro: alert and oriented, no focal deficits  Assessment & Plan:    Essential hypertension, benign  BP low today. Due to bilateral LE swelling will d/c norvasc and start lisinopril 10 mg. Nurse visit 1 week to recheck BP. Will check BMP today and again at next visit in 1 week. Patient taking potassium, may have to d/c this as well. Discussed angioedema and cough as side effects for ACEi. Patient to call with any concerns/questions. Patient  verbalized understanding and agreement with plan.   Screening for STD (sexually transmitted disease)  Patient requesting screen for RPR and HIV. Will collect w/ other labs  Weight loss counseling, encounter for  Patient with difficulty losing weight. Last TSH last year was normal, doubt this is an issue at this time. Discussed calorie counting, praised exercise efforts. Continue walking for exercise 30 minutes 5x a week. Will add wellbutrin 100 mg daily and titrate up to twice a day if tolerated. Discussed side effects with patient. For now take this every morning w/ breakfast. Will see if this helps suppress appetite, would also help mood as patient has had depression symptoms in past. Asked her to call with any issues with this medicaiton. The patient indicates understanding of these issues and agrees with the plan.     Return in about 4 weeks (around 01/15/2018) for HTN, mood.   [name], DO Family Medicine Resident PGY-3

## 2017-12-18 NOTE — Assessment & Plan Note (Addendum)
  Patient with difficulty losing weight. Last TSH last year was normal, doubt this is an issue at this time. Discussed calorie counting, praised exercise efforts. Continue walking for exercise 30 minutes 5x a week. Will add wellbutrin 100 mg daily and titrate up to twice a day if tolerated. Discussed side effects with patient. For now take this every morning w/ breakfast. Will see if this helps suppress appetite, would also help mood as patient has had depression symptoms in past. Asked her to call with any issues with this medicaiton. The patient indicates understanding of these issues and agrees with the plan.

## 2017-12-18 NOTE — Assessment & Plan Note (Signed)
  BP low today. Due to bilateral LE swelling will d/c norvasc and start lisinopril 10 mg. Nurse visit 1 week to recheck BP. Will check BMP today and again at next visit in 1 week. Patient taking potassium, may have to d/c this as well. Discussed angioedema and cough as side effects for ACEi. Patient to call with any concerns/questions. Patient verbalized understanding and agreement with plan.

## 2017-12-18 NOTE — Patient Instructions (Addendum)
Let's stop the amlodipine (norvasc) and start taking lisinopril tomorrow 8/15.  If you have lip swelling or throat swelling immediately stop lisinopril and go to the ED. Some people develop a dry cough, let me know if that happens to you.  We'll repeat blood pressure in 1 week and repeat labs in 1 week.  I'll see you in 1 month to see how weight loss and blood pressure are doing.   If you have questions or concerns please do not hesitate to call at [phone].  [name], DO PGY-3, [facility] 12/18/2017 10:17 AM  Bupropion tablets  What is this medicine? BUPROPION (byoo PROE pee on) is used to treat depression. This medicine may be used for other purposes; ask your health care provider or pharmacist if you have questions. COMMON BRAND NAME(S): Wellbutrin What should I tell my health care provider before I take this medicine? They need to know if you have any of these conditions: -an eating disorder, such as anorexia or bulimia -bipolar disorder or psychosis -diabetes or high blood sugar, treated with medication -glaucoma -heart disease, previous heart attack, or irregular heart beat -head injury or brain tumor -high blood pressure -kidney or liver disease -seizures -suicidal thoughts or a previous suicide attempt -Tourette's syndrome -weight loss -an unusual or allergic reaction to bupropion, other medicines, foods, dyes, or preservatives -breast-feeding -pregnant or trying to become pregnant How should I use this medicine? Take this medicine by mouth with a glass of water. Follow the directions on the prescription label. You can take it with or without food. If it upsets your stomach, take it with food. Take your medicine at regular intervals. Do not take your medicine more often than directed. Do not stop taking this medicine suddenly except upon the advice of your doctor. Stopping this medicine too quickly may cause serious side effects or your condition  may worsen. A special MedGuide will be given to you by the pharmacist with each prescription and refill. Be sure to read this information carefully each time. Talk to your pediatrician regarding the use of this medicine in children. Special care may be needed. Overdosage: If you think you have taken too much of this medicine contact a poison control center or emergency room at once. NOTE: This medicine is only for you. Do not share this medicine with others. What if I miss a dose? If you miss a dose, take it as soon as you can. If it is less than four hours to your next dose, take only that dose and skip the missed dose. Do not take double or extra doses. What may interact with this medicine? Do not take this medicine with any of the following medications: -linezolid -MAOIs like Azilect, Carbex, Eldepryl, Marplan, Nardil, and Parnate -methylene blue (injected into a vein) -other medicines that contain bupropion like Zyban This medicine may also interact with the following medications: -alcohol -certain medicines for anxiety or sleep -certain medicines for blood pressure like metoprolol, propranolol -certain medicines for depression or psychotic disturbances -certain medicines for HIV or AIDS like efavirenz, lopinavir, nelfinavir, ritonavir -certain medicines for irregular heart beat like propafenone, flecainide -certain medicines for Parkinson's disease like amantadine, levodopa -certain medicines for seizures like carbamazepine, phenytoin, phenobarbital -cimetidine -clopidogrel -cyclophosphamide -digoxin -furazolidone -isoniazid -nicotine -orphenadrine -procarbazine -steroid medicines like prednisone or cortisone -stimulant medicines for attention disorders, weight loss, or to stay awake -tamoxifen -theophylline -thiotepa -ticlopidine -tramadol -warfarin This list may not describe all possible interactions. Give your health care provider  a list of all the medicines, herbs,  non-prescription drugs, or dietary supplements you use. Also tell them if you smoke, drink alcohol, or use illegal drugs. Some items may interact with your medicine. What should I watch for while using this medicine? Tell your doctor if your symptoms do not get better or if they get worse. Visit your doctor or health care professional for regular checks on your progress. Because it may take several weeks to see the full effects of this medicine, it is important to continue your treatment as prescribed by your doctor. Patients and their families should watch out for new or worsening thoughts of suicide or depression. Also watch out for sudden changes in feelings such as feeling anxious, agitated, panicky, irritable, hostile, aggressive, impulsive, severely restless, overly excited and hyperactive, or not being able to sleep. If this happens, especially at the beginning of treatment or after a change in dose, call your health care professional. Avoid alcoholic drinks while taking this medicine. Drinking excessive alcoholic beverages, using sleeping or anxiety medicines, or quickly stopping the use of these agents while taking this medicine may increase your risk for a seizure. Do not drive or use heavy machinery until you know how this medicine affects you. This medicine can impair your ability to perform these tasks. Do not take this medicine close to bedtime. It may prevent you from sleeping. Your mouth may get dry. Chewing sugarless gum or sucking hard candy, and drinking plenty of water may help. Contact your doctor if the problem does not go away or is severe. What side effects may I notice from receiving this medicine? Side effects that you should report to your doctor or health care professional as soon as possible: -allergic reactions like skin rash, itching or hives, swelling of the face, lips, or tongue -breathing problems -changes in vision -confusion -elevated mood, decreased need for sleep,  racing thoughts, impulsive behavior -fast or irregular heartbeat -hallucinations, loss of contact with reality -increased blood pressure -redness, blistering, peeling or loosening of the skin, including inside the mouth -seizures -suicidal thoughts or other mood changes -unusually weak or tired -vomiting Side effects that usually do not require medical attention (report to your doctor or health care professional if they continue or are bothersome): -constipation -headache -loss of appetite -nausea -tremors -weight loss This list may not describe all possible side effects. Call your doctor for medical advice about side effects. You may report side effects to FDA at 1-800-FDA-1088. Where should I keep my medicine? Keep out of the reach of children. Store at room temperature between 20 and 25 degrees C (68 and 77 degrees F), away from direct sunlight and moisture. Keep tightly closed. Throw away any unused medicine after the expiration date. NOTE: This sheet is a summary. It may not cover all possible information. If you have questions about this medicine, talk to your doctor, pharmacist, or health care provider.  2018 Elsevier/Gold Standard (2015-10-14 13:44:21)

## 2017-12-18 NOTE — Assessment & Plan Note (Signed)
  Patient requesting screen for RPR and HIV. Will collect w/ other labs

## 2017-12-19 LAB — BASIC METABOLIC PANEL
BUN/Creatinine Ratio: 10 (ref 9–23)
BUN: 7 mg/dL (ref 6–24)
CO2: 25 mmol/L (ref 20–29)
Calcium: 9.6 mg/dL (ref 8.7–10.2)
Chloride: 103 mmol/L (ref 96–106)
Creatinine, Ser: 0.68 mg/dL (ref 0.57–1.00)
GFR calc Af Amer: 123 mL/min/{1.73_m2} (ref >59)
GFR calc non Af Amer: 107 mL/min/{1.73_m2} (ref >59)
Glucose: 107 mg/dL — ABNORMAL HIGH (ref 65–99)
Potassium: 3.6 mmol/L (ref 3.5–5.2)
Sodium: 141 mmol/L (ref 134–144)

## 2017-12-19 LAB — HIV ANTIBODY (ROUTINE TESTING W REFLEX): HIV Screen 4th Generation wRfx: NONREACTIVE

## 2017-12-19 LAB — RPR: RPR Ser Ql: NONREACTIVE

## 2017-12-20 ENCOUNTER — Encounter: Payer: Self-pay | Admitting: Family Medicine

## 2017-12-26 ENCOUNTER — Other Ambulatory Visit: Payer: Self-pay

## 2017-12-26 ENCOUNTER — Ambulatory Visit: Payer: BLUE CROSS/BLUE SHIELD

## 2017-12-26 VITALS — BP 124/78

## 2017-12-26 DIAGNOSIS — I10 Essential (primary) hypertension: Secondary | ICD-10-CM

## 2017-12-26 NOTE — Progress Notes (Signed)
Patient here today for BP check.     BP today is 124/78.  Checked BP in R arm with Large cuff.  Symptoms present: none.  Patient last took BP med 0630 today.  Pt to lab for BMP.  Routed note to PCP.  Wallace Cullens, RN

## 2017-12-27 LAB — BASIC METABOLIC PANEL
BUN/Creatinine Ratio: 13 (ref 9–23)
BUN: 8 mg/dL (ref 6–24)
CO2: 23 mmol/L (ref 20–29)
Calcium: 9.7 mg/dL (ref 8.7–10.2)
Chloride: 98 mmol/L (ref 96–106)
Creatinine, Ser: 0.6 mg/dL (ref 0.57–1.00)
GFR calc Af Amer: 128 mL/min/{1.73_m2} (ref >59)
GFR calc non Af Amer: 111 mL/min/{1.73_m2} (ref >59)
Glucose: 119 mg/dL — ABNORMAL HIGH (ref 65–99)
Potassium: 3.8 mmol/L (ref 3.5–5.2)
Sodium: 138 mmol/L (ref 134–144)

## 2018-01-09 ENCOUNTER — Other Ambulatory Visit: Payer: Self-pay

## 2018-01-09 MED ORDER — HYDROCHLOROTHIAZIDE 25 MG PO TABS: 25.0000 mg | ORAL_TABLET | Freq: Every morning | ORAL | 3 refills | Status: DC

## 2018-01-09 MED ORDER — ATORVASTATIN CALCIUM 40 MG PO TABS: 40.0000 mg | ORAL_TABLET | Freq: Every day | ORAL | 3 refills | Status: DC

## 2018-01-14 ENCOUNTER — Other Ambulatory Visit: Payer: Self-pay | Admitting: Family Medicine

## 2018-01-17 ENCOUNTER — Ambulatory Visit (INDEPENDENT_AMBULATORY_CARE_PROVIDER_SITE_OTHER): Payer: BLUE CROSS/BLUE SHIELD | Admitting: Family Medicine

## 2018-01-17 ENCOUNTER — Other Ambulatory Visit: Payer: Self-pay

## 2018-01-17 ENCOUNTER — Ambulatory Visit: Payer: BLUE CROSS/BLUE SHIELD | Admitting: Family Medicine

## 2018-01-17 VITALS — BP 126/74 | HR 87 | Temp 98.4°F | Ht 63.0 in | Wt 300.8 lb

## 2018-01-17 DIAGNOSIS — I10 Essential (primary) hypertension: Secondary | ICD-10-CM | POA: Diagnosis not present

## 2018-01-17 DIAGNOSIS — F329 Major depressive disorder, single episode, unspecified: Secondary | ICD-10-CM | POA: Diagnosis not present

## 2018-01-17 DIAGNOSIS — Z713 Dietary counseling and surveillance: Secondary | ICD-10-CM

## 2018-01-17 DIAGNOSIS — R4589 Other symptoms and signs involving emotional state: Secondary | ICD-10-CM

## 2018-01-17 NOTE — Progress Notes (Signed)
     Subjective: Chief Complaint  Patient presents with  . Hypertension    HPI: Stacey Robbins is a 47 y.o. presenting to clinic today to discuss the following:  BP Follow Up Patient started on Lisinopril from last visit due to having bilateral pedal edema with Norvasc. BP well controlled at today's visit, pedal edema has resolved, and patient reports no side effects from Lisinopril such as dry cough, dizziness, lightheadedness.  Weight Loss  Patient started on Wellburtrin 100mg  daily in the morning to help with appetite suppression. Patient continues to try and eat a balanced diet and exercise. She has lost 3lbs since her last visit and is down 5lbs for the month.  Mood Patient reports her mood is good and her PHQ-9 score today was 0. MDQ was no suggestive of any current or past mood disorder.  Health Maintenance: none     ROS noted in HPI.   Past Medical, Surgical, Social, and Family History Reviewed & Updated per EMR.   Pertinent Historical Findings include:   Social History   Tobacco Use  Smoking Status Never Smoker  Smokeless Tobacco Never Used    Objective: BP 126/74   Pulse 87   Temp 98.4 F (36.9 C) (Oral)   Ht 5\' 3"  (1.6 m)   Wt (!) 300 lb 12.8 oz (136.4 kg)   LMP 01/09/2018 (Exact Date)   SpO2 98%   BMI 53.28 kg/m  Vitals and nursing notes reviewed  Physical Exam Gen: Alert and Oriented x 3, NAD HEENT: Normocephalic, atraumatic, PERRLA, EOMI CV: RRR, no murmurs, normal S1, S2 split Resp: CTAB, no wheezing, rales, or rhonchi, comfortable work of breathing MSK: Moves all four extremities Ext: no clubbing, cyanosis, or edema Neuro: No gross deficits Skin: warm, dry, intact, no rashes  No results found for this or any previous visit (from the past 72 hour(s)).  Assessment/Plan:  Essential hypertension, benign Last BMP done one week after Lisinopril was started was within normal limits for potassium. BP well controlled at this visit, continue  Lisinopril 10mg  daily.  Patient is also losing weight and we discussed this will also help her blood pressure and if she loses enough weight she may be able to stop all medications completely.  Weight loss counseling, encounter for Patient only took Wellburtrin two times and stopped and feels like she can lose weight without medication at this time. Patient is exercising for 30 minutes a day 5 times per week at home but just recently joined a gym. She is avoiding fast food, fried foods, sweet drinks, and foods high in sugar.  Patient has lost 3lbs since her last vist   PATIENT EDUCATION PROVIDED: See AVS    Diagnosis and plan along with any newly prescribed medication(s) were discussed in detail with this patient today. The patient verbalized understanding and agreed with the plan. Patient advised if symptoms worsen return to clinic or ER.   Health Maintainance:   No orders of the defined types were placed in this encounter.   No orders of the defined types were placed in this encounter.    Harolyn Rutherford, DO 01/17/2018, 9:29 AM PGY-2 [place]

## 2018-01-17 NOTE — Patient Instructions (Signed)
It was great to meet you today! Thank you for letting me participate in your care!   Today, we discussed your weight loss and you are doing great! Keep up the exercise and and the balanced diet. Avoid fast food a sweet drinks like sodas and sweet tea.   Continue taking Lisinopril as your blood pressure is well controlled.  Return in 6 months to check on your continued weight loss and to ensure your blood pressure is remaining well controlled.  Be well, Stacey Rutherford, DO PGY-2, [facility]

## 2018-01-17 NOTE — Assessment & Plan Note (Signed)
Patient only took Wellburtrin two times and stopped and feels like she can lose weight without medication at this time. Patient is exercising for 30 minutes a day 5 times per week at home but just recently joined a gym. She is avoiding fast food, fried foods, sweet drinks, and foods high in sugar.  Patient has lost 3lbs since her last vist

## 2018-01-17 NOTE — Assessment & Plan Note (Signed)
Last BMP done one week after Lisinopril was started was within normal limits for potassium. BP well controlled at this visit, continue Lisinopril 10mg  daily.  Patient is also losing weight and we discussed this will also help her blood pressure and if she loses enough weight she may be able to stop all medications completely.

## 2018-02-11 ENCOUNTER — Other Ambulatory Visit: Payer: Self-pay | Admitting: Family Medicine

## 2018-02-12 NOTE — Telephone Encounter (Signed)
Per last office note patient did not want to continue wellbutrin for weight loss. Can we clarify? Also would not refill her potassium as it was normal last check end of August and she is on ACE inhibitor.

## 2018-02-13 NOTE — Telephone Encounter (Signed)
Pt RC to [facility].  I attempted to call pt but have to LMOVM for callback. Charan Prieto, Salome Spotted, CMA

## 2018-02-13 NOTE — Telephone Encounter (Signed)
Thank you :)

## 2018-02-13 NOTE — Telephone Encounter (Signed)
Pt called back.  She does not want the Wellbutrin and I explained the potassium to her.  She has not further questions. [place], Salome Spotted, CMA

## 2018-02-13 NOTE — Telephone Encounter (Signed)
LVM for pt to return my call to clarify.

## 2018-03-23 IMAGING — CR DG CHEST 2V
2 series · 2 of 2 positions shown · non-contrast
Comparison: 10/14/2012

CLINICAL DATA: Left-sided breast and arm pain, initial encounter

EXAM:
CHEST  2 VIEW

[chest pa]
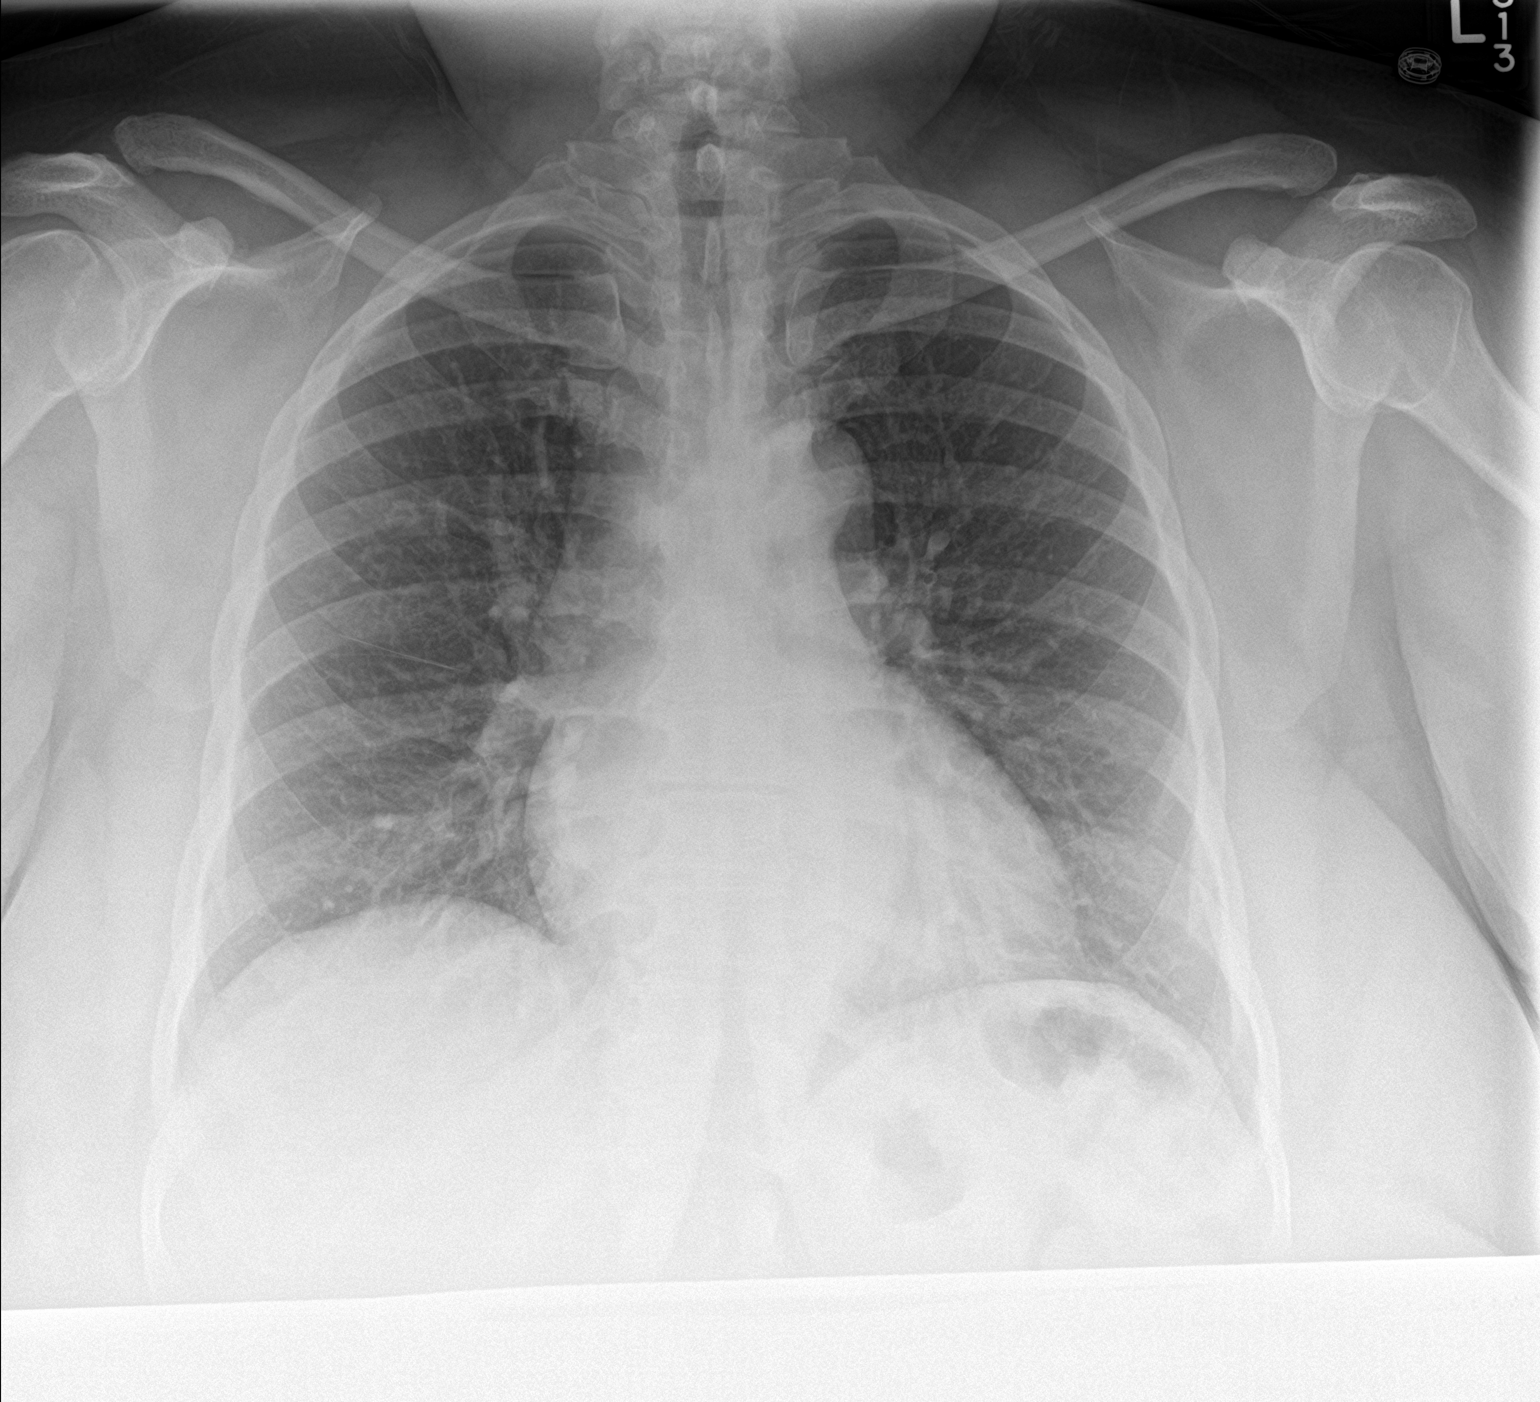

[chest lat]
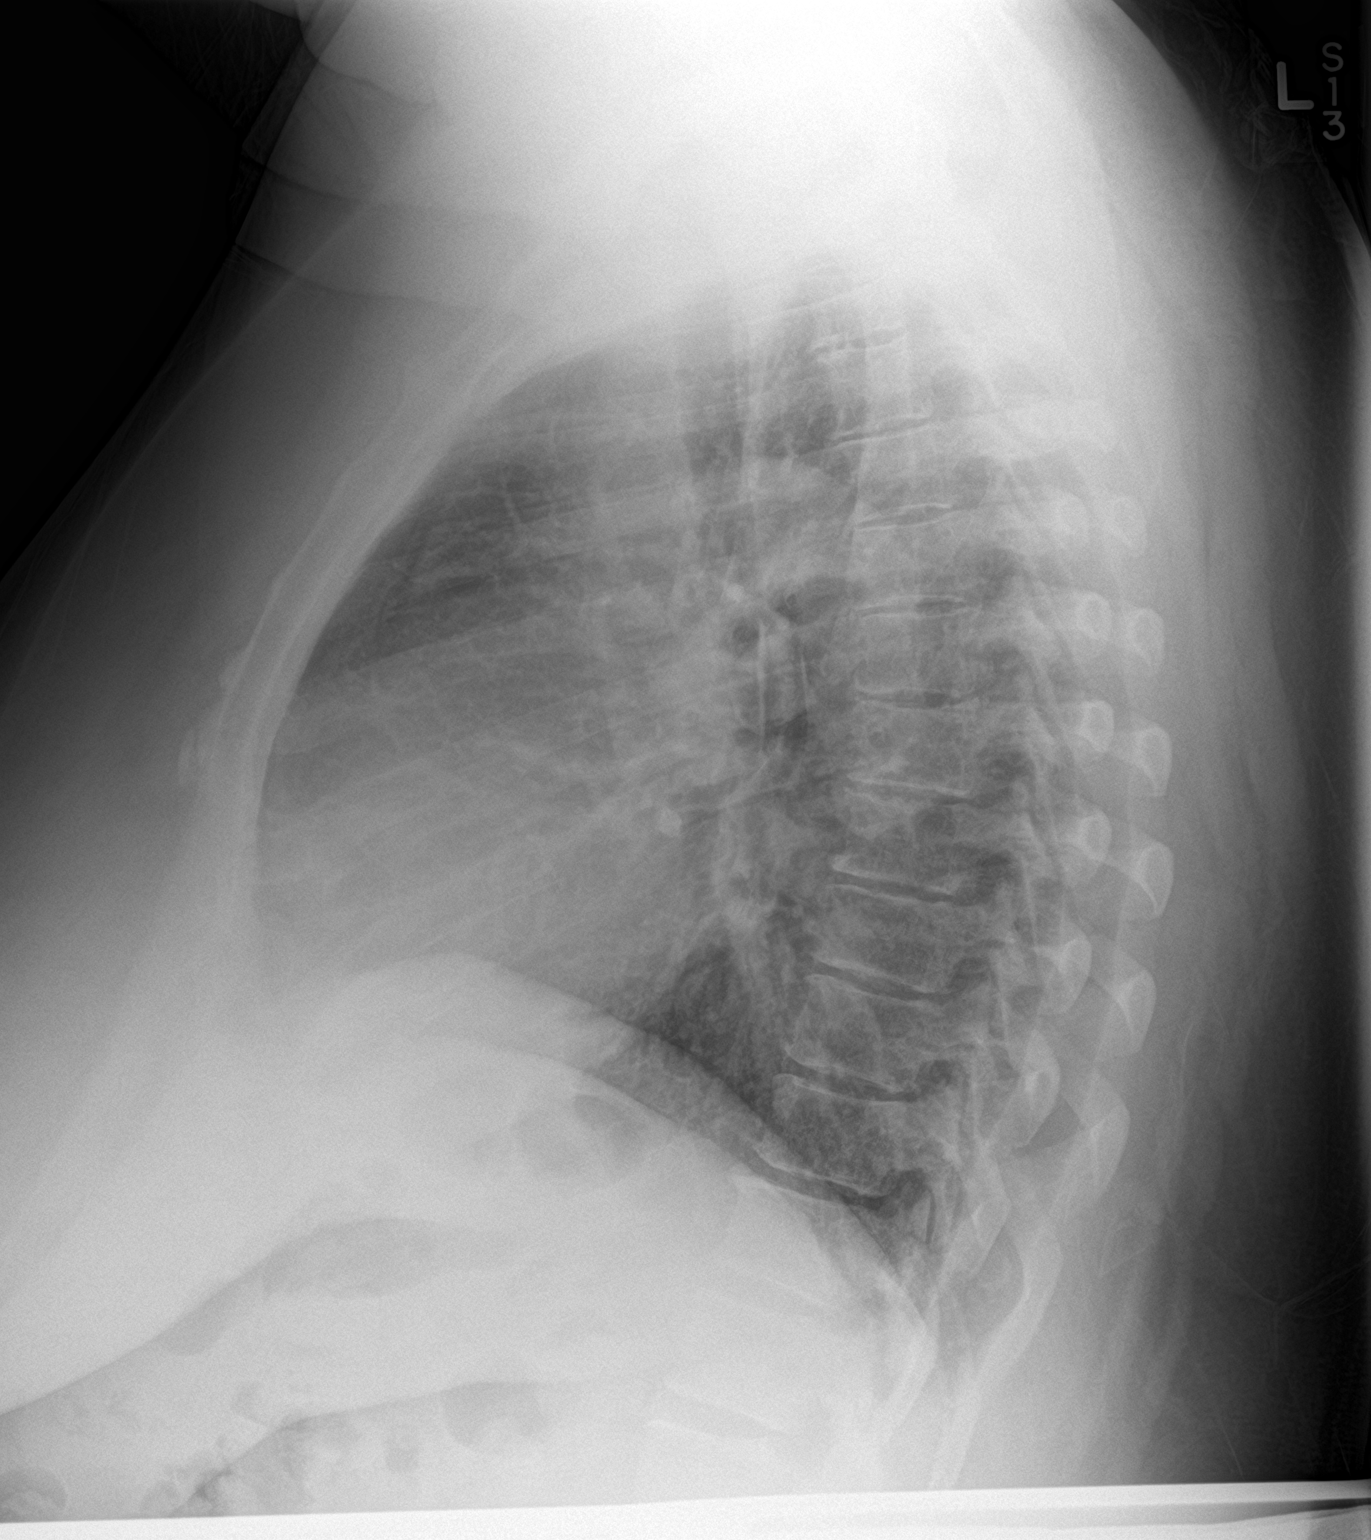

[2 of 2 positions shown; findings below may reference images not displayed]

FINDINGS: The heart size and mediastinal contours are within normal limits.
Both lungs are clear. The visualized skeletal structures are
unremarkable.
IMPRESSION: No active cardiopulmonary disease.

## 2018-12-19 ENCOUNTER — Ambulatory Visit: Payer: BLUE CROSS/BLUE SHIELD | Admitting: Student in an Organized Health Care Education/Training Program

## 2018-12-26 ENCOUNTER — Other Ambulatory Visit: Payer: Self-pay

## 2018-12-26 ENCOUNTER — Ambulatory Visit: Payer: BC Managed Care – PPO | Admitting: Family Medicine

## 2018-12-26 ENCOUNTER — Encounter: Payer: Self-pay | Admitting: Family Medicine

## 2018-12-26 VITALS — BP 146/106 | HR 97 | Wt 308.2 lb

## 2018-12-26 DIAGNOSIS — Z0000 Encounter for general adult medical examination without abnormal findings: Secondary | ICD-10-CM

## 2018-12-26 DIAGNOSIS — Z113 Encounter for screening for infections with a predominantly sexual mode of transmission: Secondary | ICD-10-CM | POA: Diagnosis not present

## 2018-12-26 DIAGNOSIS — E1165 Type 2 diabetes mellitus with hyperglycemia: Secondary | ICD-10-CM | POA: Insufficient documentation

## 2018-12-26 DIAGNOSIS — R7303 Prediabetes: Secondary | ICD-10-CM

## 2018-12-26 DIAGNOSIS — I10 Essential (primary) hypertension: Secondary | ICD-10-CM | POA: Diagnosis not present

## 2018-12-26 DIAGNOSIS — Z6841 Body Mass Index (BMI) 40.0 and over, adult: Secondary | ICD-10-CM

## 2018-12-26 DIAGNOSIS — E782 Mixed hyperlipidemia: Secondary | ICD-10-CM

## 2018-12-26 DIAGNOSIS — Z0001 Encounter for general adult medical examination with abnormal findings: Secondary | ICD-10-CM

## 2018-12-26 LAB — POCT GLYCOSYLATED HEMOGLOBIN (HGB A1C): Hemoglobin A1C: 6.4 % — AB (ref 4.0–5.6)

## 2018-12-26 LAB — POCT CBG (FASTING - GLUCOSE)-MANUAL ENTRY: Glucose Fasting, POC: 121 mg/dL — AB (ref 70–99)

## 2018-12-26 MED ORDER — HYDROCHLOROTHIAZIDE 25 MG PO TABS: 25.0000 mg | ORAL_TABLET | Freq: Every morning | ORAL | 3 refills | Status: DC

## 2018-12-26 MED ORDER — ATORVASTATIN CALCIUM 40 MG PO TABS: 40.0000 mg | ORAL_TABLET | Freq: Every day | ORAL | 3 refills | Status: DC

## 2018-12-26 MED ORDER — LISINOPRIL 10 MG PO TABS: ORAL_TABLET | ORAL | 3 refills | Status: DC

## 2018-12-26 NOTE — Assessment & Plan Note (Signed)
Refilled Atorvastatin 40 mg.  Lipid panel pending.

## 2018-12-26 NOTE — Assessment & Plan Note (Addendum)
Patient aunt (mom sister) has breast cancer. Margaretmary reports abnormal mammogram at age 46. Referred for mammogram.  Pap due but patient declined.  Most recent PAP smear 12/09/15: NEGATIVE FOR INTRAEPITHELIAL LESIONS OR MALIGNANCY and no high risk HPV detected. RPR and HIV pending.

## 2018-12-26 NOTE — Patient Instructions (Addendum)
It was great seeing you today!  I referred you to nutriotion.   I'd like to see you back 1 year but if you need to be seen earlier than that for any new issues we're happy to fit you in, just give [place] a call!   If you have questions or concerns please do not hesitate to call at [phone].  Health Maintenance, Female Adopting a healthy lifestyle and getting preventive care are important in promoting health and wellness. Ask your health care provider about:  The right schedule for you to have regular tests and exams.  Things you can do on your own to prevent diseases and keep yourself healthy. What should I know about diet, weight, and exercise? Eat a healthy diet   Eat a diet that includes plenty of vegetables, fruits, low-fat dairy products, and lean protein.  Do not eat a lot of foods that are high in solid fats, added sugars, or sodium. Maintain a healthy weight Body mass index (BMI) is used to identify weight problems. It estimates body fat based on height and weight. Your health care provider can help determine your BMI and help you achieve or maintain a healthy weight. Get regular exercise Get regular exercise. This is one of the most important things you can do for your health. Most adults should:  Exercise for at least 150 minutes each week. The exercise should increase your heart rate and make you sweat (moderate-intensity exercise).  Do strengthening exercises at least twice a week. This is in addition to the moderate-intensity exercise.  Spend less time sitting. Even light physical activity can be beneficial. Watch cholesterol and blood lipids Have your blood tested for lipids and cholesterol at 46 years of age, then have this test every 5 years. Have your cholesterol levels checked more often if:  Your lipid or cholesterol levels are high.  You are older than 46 years of age.  You are at high risk for heart disease. What should I know about cancer screening?  Depending on your health history and family history, you may need to have cancer screening at various ages. This may include screening for:  Breast cancer.  Cervical cancer.  Colorectal cancer.  Skin cancer.  Lung cancer. What should I know about heart disease, diabetes, and high blood pressure? Blood pressure and heart disease  High blood pressure causes heart disease and increases the risk of stroke. This is more likely to develop in people who have high blood pressure readings, are of African descent, or are overweight.  Have your blood pressure checked: ? Every 3-5 years if you are 46-69 years of age. ? Every year if you are 46 years old or older. Diabetes Have regular diabetes screenings. This checks your fasting blood sugar level. Have the screening done:  Once every three years after age 17 if you are at a normal weight and have a low risk for diabetes.  More often and at a younger age if you are overweight or have a high risk for diabetes. What should I know about preventing infection? Hepatitis B If you have a higher risk for hepatitis B, you should be screened for this virus. Talk with your health care provider to find out if you are at risk for hepatitis B infection. Hepatitis C Testing is recommended for:  Everyone born from [birth year] through 1963.  Anyone with known risk factors for hepatitis C. Sexually transmitted infections (STIs)  Get screened for STIs, including gonorrhea and chlamydia, if: ? You  are sexually active and are younger than 46 years of age. ? You are older than 46 years of age and your health care provider tells you that you are at risk for this type of infection. ? Your sexual activity has changed since you were last screened, and you are at increased risk for chlamydia or gonorrhea. Ask your health care provider if you are at risk.  Ask your health care provider about whether you are at high risk for HIV. Your health care provider may recommend a  prescription medicine to help prevent HIV infection. If you choose to take medicine to prevent HIV, you should first get tested for HIV. You should then be tested every 3 months for as long as you are taking the medicine. Pregnancy  If you are about to stop having your period (premenopausal) and you may become pregnant, seek counseling before you get pregnant.  Take 400 to 800 micrograms (mcg) of folic acid every day if you become pregnant.  Ask for birth control (contraception) if you want to prevent pregnancy. Osteoporosis and menopause Osteoporosis is a disease in which the bones lose minerals and strength with aging. This can result in bone fractures. If you are 46 years of older, or if you are at risk for osteoporosis and fractures, ask your health care provider if you should:  Be screened for bone loss.  Take a calcium or vitamin D supplement to lower your risk of fractures.  Be given hormone replacement therapy (HRT) to treat symptoms of menopause. Follow these instructions at home: Lifestyle  Do not use any products that contain nicotine or tobacco, such as cigarettes, e-cigarettes, and chewing tobacco. If you need help quitting, ask your health care provider.  Do not use street drugs.  Do not share needles.  Ask your health care provider for help if you need support or information about quitting drugs. Alcohol use  Do not drink alcohol if: ? Your health care provider tells you not to drink. ? You are pregnant, may be pregnant, or are planning to become pregnant.  If you drink alcohol: ? Limit how much you use to 0-1 drink a day. ? Limit intake if you are breastfeeding.  Be aware of how much alcohol is in your drink. In the [place] one drink equals one 12 oz bottle of beer (355 mL), one 5 oz glass of wine (148 mL), or one 1 oz glass of hard liquor (44 mL). General instructions  Schedule regular health, dental, and eye exams.  Stay current with your vaccines.   Tell your health care provider if: ? You often feel depressed. ? You have ever been abused or do not feel safe at home. Summary  Adopting a healthy lifestyle and getting preventive care are important in promoting health and wellness.  Follow your health care provider's instructions about healthy diet, exercising, and getting tested or screened for diseases.  Follow your health care provider's instructions on monitoring your cholesterol and blood pressure. This information is not intended to replace advice given to you by your health care provider. Make sure you discuss any questions you have with your health care provider. Document Released: 11/06/2010 Document Revised: 04/16/2018 Document Reviewed: 04/16/2018 Elsevier Patient Education  2020 [employer].

## 2018-12-26 NOTE — Assessment & Plan Note (Addendum)
Stable. Refilled HCTZ 25 mg and Lisinopril 10 mg. Blood pressure initially elevated 146/104 today.  Recheck  left arm sitting 138/86.  No medication changes at this time. BMP pending.

## 2018-12-26 NOTE — Assessment & Plan Note (Signed)
Fasting CBG 121.  A1C 6.4.  Discussed with patient who is working to lose weight to avoid DM diagnosis. Will not initiate metformin at this time.

## 2018-12-26 NOTE — Progress Notes (Signed)
Subjective:  Stacey Robbins is a 48 y.o. female who presents to the [facility] today with a chief complaint of annual physical.   HPI: Stacey Robbins here for annual physical. Expressed concerns  regarding weight loss.  HTN Taking medications as prescribed. Occasional headaches but no blurry vision, chest pain, palpitations or shortness of breath.   Morbid Obesity  Patient trying to lose weight.  Has made some positive changes to her diet but "likes fried food."  She is not exercising as much during the pandemic.  Has tried Wellbutrin without significant weight loss in the past.   HLD Taking medications as prescribed.  Eats fried food regularly.   PMHx: HTN, HLD, Depression, Obesity  Surg Hx: Tubal ligation 2004 Soc Hx: Jimya denies smoking, drinking alcohol and illicit drug use.     Objective:  Physical Exam: BP (!) 146/106   Pulse 97   Wt (!) 308 lb 3.2 oz (139.8 kg)   SpO2 99%   BMI 54.60 kg/m    GEN:     Alert and no distress    HENT:  mucus membranes moist, oropharyngeal without lesions or erythema,  nares patent, no nasal discharge  EYES:   pupils equal and reactive, EOM intact NECK:  normal ROM, no lymphadenopathy  RESP:  clear to auscultation bilaterally, no increased work of breathing  CVS:   regular rate and rhythm, no murmur, distal pulses intact   ABD:  obese abdomen, soft, non-tender; bowel sounds present; no palpable masses EXT:   normal ROM, atraumatic, no edema  NEURO:  normal without focal findings,  speech normal, alert and oriented   Skin:   warm and dry Psych: Normal affect and thought content    Results for orders placed or performed in visit on 12/26/18 (from the past 72 hour(s))  HgB A1c     Status: Abnormal   Collection Time: 12/26/18 10:06 AM  Result Value Ref Range   Hemoglobin A1C 6.4 (A) 4.0 - 5.6 %   HbA1c POC (<> result, manual entry)     HbA1c, POC (prediabetic range)     HbA1c, POC (controlled diabetic range)    Glucose (CBG),  Fasting     Status: Abnormal   Collection Time: 12/26/18 10:07 AM  Result Value Ref Range   Glucose Fasting, POC 121 (A) 70 - 99 mg/dL     Assessment/Plan:  Essential hypertension, benign Stable. Refilled HCTZ 25 mg and Lisinopril 10 mg. Blood pressure initially elevated 146/104 today.  Recheck  left arm sitting 138/86.  No medication changes at this time. BMP pending.  BMI 50.0-59.9, adult ([place]) Discussed weight loss will also help her blood pressure and may be able to discontinue hypertensive medications completely. She was exercising for 30 minutes a day 3-5 times a week at the gym but gyms closed during the pandemic.  Counselled her on increasing vegetable intake and decreasing calories obtained by liquids. She has gained 8 lbs since last visit.  She has considered bariatric surgery but wants to lose the weight herself.  Referred for nutrition counseling.   Prediabetes Fasting CBG 121.  A1C 6.4.  Discussed with patient who is working to lose weight to avoid DM diagnosis. Will not initiate metformin at this time.   Mixed hyperlipidemia Refilled Atorvastatin 40 mg.  Lipid panel pending.   Healthcare maintenance Patient aunt (mom sister) has breast cancer. Delania reports abnormal mammogram at age 65. Referred for mammogram.  Pap due but patient declined.  Most recent  PAP smear 12/09/15: NEGATIVE FOR INTRAEPITHELIAL LESIONS OR MALIGNANCY and no high risk HPV detected. RPR and HIV pending.    Orders Placed This Encounter  Procedures  . MM Digital Diagnostic Bilat    Standing Status:   Future    Standing Expiration Date:   02/25/2020    Order Specific Question:   Reason for Exam (SYMPTOM  OR DIAGNOSIS REQUIRED)    Answer:   family hx of breast cancer (mom's sister)    Order Specific Question:   Is the patient pregnant?    Answer:   No    Order Specific Question:   Preferred imaging location?    Answer:   [facility]  . Basic Metabolic Panel  . Lipid Panel  . HIV antibody (with  reflex)  . RPR  . Hepatitis c antibody (reflex)  . Referral to Nutrition and Diabetes Services    Referral Priority:   Routine    Referral Type:   Consultation    Referral Reason:   Specialty Services Required    Number of Visits Requested:   1  . Glucose (CBG), Fasting  . HgB A1c    Meds ordered this encounter  Medications  . atorvastatin (LIPITOR) 40 MG tablet    Sig: Take 1 tablet (40 mg total) by mouth daily.    Dispense:  90 tablet    Refill:  3  . hydrochlorothiazide (HYDRODIURIL) 25 MG tablet    Sig: Take 1 tablet (25 mg total) by mouth every morning.    Dispense:  90 tablet    Refill:  3  . lisinopril (ZESTRIL) 10 MG tablet    Sig: TAKE 1 TABLET(10 MG) BY MOUTH DAILY    Dispense:  90 tablet    Refill:  3    Health Maintenance reviewed - patient asked to schedule her pap smear, patient asked to schedule her mammogram.   Lyndee Hensen, DO PGY-1, [facility] 12/26/2018 12:25 PM

## 2018-12-26 NOTE — Assessment & Plan Note (Signed)
Discussed weight loss will also help her blood pressure and may be able to discontinue hypertensive medications completely. She was exercising for 30 minutes a day 3-5 times a week at the gym but gyms closed during the pandemic.  Counselled her on increasing vegetable intake and decreasing calories obtained by liquids. She has gained 8 lbs since last visit.  She has considered bariatric surgery but wants to lose the weight herself.  Referred for nutrition counseling.

## 2018-12-27 LAB — LIPID PANEL
Chol/HDL Ratio: 2.9 ratio (ref 0.0–4.4)
Cholesterol, Total: 148 mg/dL (ref 100–199)
HDL: 51 mg/dL (ref >39)
LDL Calculated: 84 mg/dL (ref 0–99)
Triglycerides: 63 mg/dL (ref 0–149)
VLDL Cholesterol Cal: 13 mg/dL (ref 5–40)

## 2018-12-27 LAB — BASIC METABOLIC PANEL
BUN/Creatinine Ratio: 21 (ref 9–23)
BUN: 12 mg/dL (ref 6–24)
CO2: 23 mmol/L (ref 20–29)
Calcium: 9.2 mg/dL (ref 8.7–10.2)
Chloride: 103 mmol/L (ref 96–106)
Creatinine, Ser: 0.57 mg/dL (ref 0.57–1.00)
GFR calc Af Amer: 129 mL/min/{1.73_m2} (ref >59)
GFR calc non Af Amer: 112 mL/min/{1.73_m2} (ref >59)
Glucose: 94 mg/dL (ref 65–99)
Potassium: 3.8 mmol/L (ref 3.5–5.2)
Sodium: 140 mmol/L (ref 134–144)

## 2018-12-27 LAB — HIV ANTIBODY (ROUTINE TESTING W REFLEX): HIV Screen 4th Generation wRfx: NONREACTIVE

## 2018-12-27 LAB — HCV COMMENT:

## 2018-12-27 LAB — RPR: RPR Ser Ql: NONREACTIVE

## 2018-12-27 LAB — HEPATITIS C ANTIBODY (REFLEX): HCV Ab: 0.1 s/co ratio (ref 0.0–0.9)

## 2018-12-29 ENCOUNTER — Telehealth: Payer: Self-pay | Admitting: Family Medicine

## 2018-12-29 NOTE — Telephone Encounter (Signed)
A1C elevated.  Patient is pre-diabetic, as discussed during the vitis.  Spoke with patient about lab results. Results released in [place].

## 2019-01-01 ENCOUNTER — Other Ambulatory Visit: Payer: Self-pay | Admitting: Family Medicine

## 2019-01-01 DIAGNOSIS — R921 Mammographic calcification found on diagnostic imaging of breast: Secondary | ICD-10-CM

## 2019-02-03 ENCOUNTER — Other Ambulatory Visit: Payer: Self-pay

## 2019-02-03 ENCOUNTER — Ambulatory Visit
Admission: RE | Admit: 2019-02-03 | Discharge: 2019-02-03 | Disposition: A | Discharge status: Home / Self Care | Payer: BC Managed Care – PPO | Source: Ambulatory Visit | Attending: Family Medicine | Admitting: Family Medicine

## 2019-02-03 DIAGNOSIS — R921 Mammographic calcification found on diagnostic imaging of breast: Secondary | ICD-10-CM

## 2019-02-04 ENCOUNTER — Encounter: Payer: Self-pay | Admitting: Family Medicine

## 2019-03-04 ENCOUNTER — Other Ambulatory Visit: Payer: Self-pay

## 2019-03-04 ENCOUNTER — Encounter: Payer: Self-pay | Admitting: Registered"

## 2019-03-04 ENCOUNTER — Encounter: Payer: BC Managed Care – PPO | Attending: Family Medicine | Admitting: Registered"

## 2019-03-04 DIAGNOSIS — R7303 Prediabetes: Secondary | ICD-10-CM | POA: Diagnosis present

## 2019-03-04 NOTE — Progress Notes (Signed)
Medical Nutrition Therapy:  Appt start time: 1526 end time:  1615.  Assessment:  Primary concerns today: Pt referred due to prediabetes and weight management. Pt has a PMH of HTN, HLD. Pt present for appointment. Pt reports she feels when she gets stressed or is worried she turns to food such as sweets, chips, soda. Reports she has been doing pretty good lately by not including soda, eating fruit instead of other less beneficial snack foods and choosing low sodium chips. Pt reports having more energy since cutting back on sweets and increasing physical activity. Pt reports she recently went through a divorce and since then her stress level has improved.   Food Allergies/Intolerances: crab and shrimp: nausea, itchy throat, vomiting. Reports oysters and clams are tolerated.   GI Concerns: Sometimes gets reflux but it is rare.   Pertinent Lab Values: 12/26/2018 Hgb A1c: 6.4  Weight Hx: Pt reports she has lost some pounds since making changes with nutrition and physical activity.   Preferred Learning Style:   No preference indicated   Learning Readiness:   Ready  MEDICATIONS: See list. Reviewed.    DIETARY INTAKE:  Usual eating pattern includes 2-3 meals and 0-2 snacks per day. May skip breakfast or lunch.   Common foods: none reported.  Avoided foods: crab; shrimp; cottage cheese.   Typical Snacks: fruit such as apple, pineapple; Greek yogurt (strawberry or pineapple).   Typical Beverages:80-96 oz water; sometimes juice; sometimes sweet tea; occasionally Ginger Ale.   Location of Meals: usually kitchen table; sometimes living room; meals are eaten with daughters.   Electronics Present at [employer]: Yes: phone and TV  24-hr recall:  B ( AM): None reported.   Snk ( AM): Rice Krispie treats (2), orange juice L ( PM): Subway half 6" sub with [place], American cheese, lettuce, on white bread, lightly salted Pringles, water  Snk ( PM): None reported.  D ( PM): pork chops, green  beans, mac and cheese, water  Snk ( PM): None reported.  Beverages: 80-96 oz water;   Usual physical activity: tries to do exercises while at work;  Minutes/Week: at least 20 minutes x 5 days per week; 20-30 minutes x 5-6 days per week walking. Started the activities over the past 2 weeks.   Progress Towards Goal(s):  In progress.   Nutritional Diagnosis:  NB-1.1 Food and nutrition-related knowledge deficit As related to relationship between insulin resistance/blood sugar and dietary intake, PA.  As evidenced by no prior nutrition education by a dietitian reported; pt has questions about how to prevent diabetes/lose weight in  a healthy manner.    Intervention:  Nutrition counseling provided. Dietitian provided education regarding relationship between insulin resistance/blood sugar and dietary intake and provided education on nutrition for prediabetes and HTN. Provided education regarding benefits of physical activity on blood sugar and heart health. Counseled on mindful eating and strategies to prevent/reduce emotional eating and negative food cues. Dietitian also discussed the Diabetes Prevention Program as pt would qualify for the grant funded DPP through [facility]. Pt was interested. Dietitian let pt know we will put her on the waitlist to call when date for next group is set. Pt appeared agreeable to information/goals discussed. Pt reports she will give herself a few months and then schedule a f/u appointment.   Instructions/Goals:  Make sure to get in three meals per day. Try to have balanced meals like the My Plate example (see handout). Include lean proteins, vegetables, fruits, and whole grains at meals.   Eat  every 3-5 hours while awake. Avoid eating within 3 hours of lying down.   Practice Mindful Eating  At meal and snack times, put away electronics (TV, phone, tablet, etc.) and try to eat seated at a table so you can better focus on eating your meal/snack and promote listening to your  body's fullness and hunger signals.  If you feel that you are wanting to snack because your are bored or due to emotions and not because you are hungry, try to do a fun activity (read a book, take a walk, talk with a friend, etc.) for at least 20 minutes. Make a list of go to activities and have all you need for the activity available.   Make physical activity a part of your week. Try to include at least 30 minutes of physical activity 5 days each week or at least 150 minutes per week. Regular physical activity promotes overall health-including helping to reduce risk for heart disease and diabetes, promoting mental health, and helping [place] sleep better.    Teaching Method Utilized:  Visual Auditory  Handouts given during visit include:  Balanced plate and food list.   DPP Pamphlet   Barriers to learning/adherence to lifestyle change: None indicated.   Demonstrated degree of understanding via:  Teach Back   Monitoring/Evaluation:  Dietary intake, exercise, and body weight prn. Contact information provided.

## 2019-03-04 NOTE — Patient Instructions (Signed)
Instructions/Goals:  Make sure to get in three meals per day. Try to have balanced meals like the My Plate example (see handout). Include lean proteins, vegetables, fruits, and whole grains at meals.   Eat every 3-5 hours while awake. Avoid eating within 3 hours of lying down.   Practice Mindful Eating  At meal and snack times, put away electronics (TV, phone, tablet, etc.) and try to eat seated at a table so you can better focus on eating your meal/snack and promote listening to your body's fullness and hunger signals.  If you feel that you are wanting to snack because your are bored or due to emotions and not because you are hungry, try to do a fun activity (read a book, take a walk, talk with a friend, etc.) for at least 20 minutes. Make a list of go to activities and have all you need for the activity available.   Make physical activity a part of your week. Try to include at least 30 minutes of physical activity 5 days each week or at least 150 minutes per week. Regular physical activity promotes overall health-including helping to reduce risk for heart disease and diabetes, promoting mental health, and helping [place] sleep better.

## 2019-12-27 ENCOUNTER — Other Ambulatory Visit: Payer: Self-pay | Admitting: Family Medicine

## 2019-12-28 ENCOUNTER — Encounter: Payer: Self-pay | Admitting: Family Medicine

## 2019-12-28 ENCOUNTER — Ambulatory Visit: Payer: 59 | Admitting: Family Medicine

## 2019-12-28 ENCOUNTER — Other Ambulatory Visit (HOSPITAL_COMMUNITY)
Admission: RE | Admit: 2019-12-28 | Discharge: 2019-12-28 | Disposition: A | Discharge status: Home / Self Care | Payer: 59 | Source: Ambulatory Visit | Attending: Family Medicine | Admitting: Family Medicine

## 2019-12-28 ENCOUNTER — Telehealth: Payer: Self-pay

## 2019-12-28 ENCOUNTER — Other Ambulatory Visit: Payer: Self-pay

## 2019-12-28 VITALS — BP 150/88 | HR 101 | Wt 310.8 lb

## 2019-12-28 DIAGNOSIS — R631 Polydipsia: Secondary | ICD-10-CM | POA: Diagnosis not present

## 2019-12-28 DIAGNOSIS — Z113 Encounter for screening for infections with a predominantly sexual mode of transmission: Secondary | ICD-10-CM | POA: Insufficient documentation

## 2019-12-28 DIAGNOSIS — R7303 Prediabetes: Secondary | ICD-10-CM | POA: Diagnosis not present

## 2019-12-28 DIAGNOSIS — Z1211 Encounter for screening for malignant neoplasm of colon: Secondary | ICD-10-CM

## 2019-12-28 DIAGNOSIS — L732 Hidradenitis suppurativa: Secondary | ICD-10-CM

## 2019-12-28 DIAGNOSIS — Z1239 Encounter for other screening for malignant neoplasm of breast: Secondary | ICD-10-CM

## 2019-12-28 DIAGNOSIS — I10 Essential (primary) hypertension: Secondary | ICD-10-CM | POA: Diagnosis not present

## 2019-12-28 DIAGNOSIS — Z114 Encounter for screening for human immunodeficiency virus [HIV]: Secondary | ICD-10-CM

## 2019-12-28 LAB — POCT URINALYSIS DIP (MANUAL ENTRY)
Bilirubin, UA: NEGATIVE
Glucose, UA: NEGATIVE mg/dL
Ketones, POC UA: NEGATIVE mg/dL
Leukocytes, UA: NEGATIVE
Nitrite, UA: NEGATIVE
Protein Ur, POC: NEGATIVE mg/dL
Spec Grav, UA: 1.02
Urobilinogen, UA: 0.2 U/dL
pH, UA: 7

## 2019-12-28 LAB — POCT UA - MICROSCOPIC ONLY

## 2019-12-28 LAB — POCT GLYCOSYLATED HEMOGLOBIN (HGB A1C): HbA1c, POC (controlled diabetic range): 6.3 % (ref 0.0–7.0)

## 2019-12-28 MED ORDER — CEPHALEXIN 500 MG PO CAPS: 500.0000 mg | ORAL_CAPSULE | Freq: Three times a day (TID) | ORAL | 0 refills | Status: AC

## 2019-12-28 MED ORDER — CEPHALEXIN 500 MG PO CAPS: 500.0000 mg | ORAL_CAPSULE | Freq: Three times a day (TID) | ORAL | 0 refills | Status: DC

## 2019-12-28 MED ORDER — LOSARTAN POTASSIUM 25 MG PO TABS: 25.0000 mg | ORAL_TABLET | Freq: Every day | ORAL | 4 refills | Status: DC

## 2019-12-28 NOTE — Progress Notes (Addendum)
    SUBJECTIVE:   Chief compliant/HPI: annual examination  Stacey Robbins is a 49 y.o. who presents today for an annual exam. She enjoys jogging and walking for exercise.  Denies smoking, drinking alcohol or using illicit drugs.   History tabs reviewed and updated.     Review of systems form reviewed and notable for nothing.   OBJECTIVE:   BP (!) 150/88   Pulse (!) 101   Wt (!) 310 lb 12.8 oz (141 kg)   SpO2 97%   BMI 55.06 kg/m   GEN:     Alert, well appearing obese female and no distress    HENT:  mucus membranes moist, oropharyngeal without lesions or erythema,  nares patent, no nasal discharge  EYES:   pupils equal and reactive, EOM intact NECK:  supple, normal ROM, no lymphadenopathy  RESP:  clear to auscultation bilaterally, no increased work of breathing  CVS:   regular rate and rhythm, no murmur, distal pulses intact   ABD:  soft, non-tender; bowel sounds present; no palpable masses, obese abdomen GU:  deferred  EXT:   normal ROM, atraumatic, no edema  NEURO:  normal without focal findings,  speech normal, alert and oriented  Skin:   warm and dry, no rash, normal skin turgor, multiple boils in bilateral axilla, in left axilla one of which is spontaneous draining  Psych: Normal affect, appropriate speech and behavior    ASSESSMENT/PLAN:   No problem-specific Assessment & Plan notes found for this encounter.    Annual Examination  See AVS for age appropriate recommendations.   PHQ score , reviewed and discussed.  Blood pressure reviewed and was elevated.  Asked about intimate partner violence and resources given as appropriate  The patient currently uses condoms and had a tubal ligation for contraception. Folate recommended as appropriate, minimum of 400 mcg per day.   Considered the following items based upon USPSTF recommendations: Diabetes screening: ordered Screening for elevated cholesterol: ordered HIV testing: discussed Hepatitis C: discussed.  Completed on 12/26/18; tested negative.  Hepatitis B: discussed Syphilis if at high risk: discussed GC/CT not at high risk and not ordered. Reviewed risk factors for latent tuberculosis and not indicated Reviewed risk factors for osteoporosis. Using FRAX tool estimated risk of major osteoporotic fracture of unable to be calculated 2/2 to patient's weight.  Early screening not ordered.   Discussed family history, BRCA testing not discussed. . Patient has regular mamogram given mom and sister's hx of breast cancer. Cervical cancer screening: prior Pap reviewed, repeat due in aug 2022 Breast cancer screening: discussed and ordered mammogram based upon personal history  Colorectal cancer screening: discussed, colonoscopy ordered  Hidradenitis suppurativa:  Dx discussed with patient and likely need for Rx again in the future.  Keflex Rx sent to pharmacy.     Follow up in 1 year or sooner if indicated.    Lyndee Hensen, [place]

## 2019-12-28 NOTE — Telephone Encounter (Signed)
Patient has been updated.

## 2019-12-28 NOTE — Telephone Encounter (Signed)
Patient calls nurse line reporting she received a text from pharmacy informing her the antibiotic is ready, however she noticed the quantity is only for #7. Patient reports the directions say take one by mouth 3x per day. Patient is doubling checking with provider before she picks up prescription to make sure the quantity is correct. Please advise.

## 2019-12-28 NOTE — Patient Instructions (Signed)
It was great seeing you today!  Please check-out at the front desk before leaving the clinic. I'd like to see you back in 1 month for blood pressure follow up but if you need to be seen earlier than that for any new issues we're happy to fit you in, just give [place] a call!  Visit Remembers: - Stop by the pharmacy to pick up your prescriptions - Continue to work on your healthy eating habits and incorporating exercise into your daily life.  - Your goal is to have an BP <120/80 - At your next appointment we will further discuss options to getting to a healthier weight   Regarding lab work today:  Due to recent changes in healthcare laws, you may see the results of your imaging and laboratory studies on MyChart before your provider has had a chance to review them.  I understand that in some cases there may be results that are confusing or concerning to you. Not all laboratory results come back in the same time frame and you may be waiting for multiple results in order to interpret others.  Please give [place] 72 hours in order for your provider to thoroughly review all the results before contacting the office for clarification of your results. If everything is normal, you will get a letter in the mail or a message in My Chart. Please give [place] a call if you do not hear from [place] after 2 weeks.  Please bring all of your medications with you to each visit.    If you haven't already, sign up for My Chart to have easy access to your labs results, and communication with your primary care physician.  Feel free to call with any questions or concerns at any time, at [phone].   Take care,  Dr. Rushie Chestnut [facility]

## 2019-12-29 LAB — RPR: RPR Ser Ql: NONREACTIVE

## 2019-12-29 LAB — HIV ANTIBODY (ROUTINE TESTING W REFLEX): HIV Screen 4th Generation wRfx: NONREACTIVE

## 2019-12-29 LAB — URINE CYTOLOGY ANCILLARY ONLY
Chlamydia: NEGATIVE
Comment: NEGATIVE
Comment: NORMAL
Neisseria Gonorrhea: NEGATIVE

## 2019-12-29 LAB — LIPID PANEL
Chol/HDL Ratio: 3.1 ratio (ref 0.0–4.4)
Cholesterol, Total: 144 mg/dL (ref 100–199)
HDL: 47 mg/dL (ref >39)
LDL Chol Calc (NIH): 79 mg/dL (ref 0–99)
Triglycerides: 94 mg/dL (ref 0–149)
VLDL Cholesterol Cal: 18 mg/dL (ref 5–40)

## 2019-12-30 ENCOUNTER — Encounter: Payer: Self-pay | Admitting: Family Medicine

## 2020-01-05 ENCOUNTER — Encounter: Payer: Self-pay | Admitting: Family Medicine

## 2020-01-05 ENCOUNTER — Other Ambulatory Visit: Payer: Self-pay | Admitting: Family Medicine

## 2020-01-05 DIAGNOSIS — L732 Hidradenitis suppurativa: Secondary | ICD-10-CM

## 2020-01-05 DIAGNOSIS — L02224 Furuncle of groin: Secondary | ICD-10-CM

## 2020-01-05 MED ORDER — MUPIROCIN 2 % EX OINT: 1.0000 "application " | TOPICAL_OINTMENT | Freq: Two times a day (BID) | CUTANEOUS | 0 refills | Status: DC

## 2020-01-05 NOTE — Progress Notes (Unsigned)
muco

## 2020-01-07 ENCOUNTER — Encounter: Payer: Self-pay | Admitting: Family Medicine

## 2020-01-25 ENCOUNTER — Ambulatory Visit (INDEPENDENT_AMBULATORY_CARE_PROVIDER_SITE_OTHER): Payer: 59 | Admitting: Family Medicine

## 2020-01-25 ENCOUNTER — Encounter: Payer: Self-pay | Admitting: Family Medicine

## 2020-01-25 ENCOUNTER — Other Ambulatory Visit: Payer: Self-pay

## 2020-01-25 VITALS — BP 126/78 | HR 93 | Wt 309.0 lb

## 2020-01-25 DIAGNOSIS — Z6841 Body Mass Index (BMI) 40.0 and over, adult: Secondary | ICD-10-CM

## 2020-01-25 DIAGNOSIS — E782 Mixed hyperlipidemia: Secondary | ICD-10-CM | POA: Diagnosis not present

## 2020-01-25 DIAGNOSIS — R7303 Prediabetes: Secondary | ICD-10-CM | POA: Diagnosis not present

## 2020-01-25 DIAGNOSIS — I10 Essential (primary) hypertension: Secondary | ICD-10-CM

## 2020-01-25 MED ORDER — LOSARTAN POTASSIUM 25 MG PO TABS: 25.0000 mg | ORAL_TABLET | Freq: Every day | ORAL | 4 refills | Status: DC

## 2020-01-25 MED ORDER — OZEMPIC (0.25 OR 0.5 MG/DOSE) 2 MG/1.5ML ~~LOC~~ SOPN: 0.2500 mg | PEN_INJECTOR | SUBCUTANEOUS | 0 refills | Status: DC

## 2020-01-25 NOTE — Assessment & Plan Note (Addendum)
A1c 6.3 and previously 6.4. Fasting CBG 121.  A1C 6.4.  Discussed with patient who is working to lose weight to avoid DM diagnosis. Recommended starting metformin but patient declined this medication as she does not want the GI side effects. Discussed side effects are short term and likely resolve after her body adjusts to the medication. Patient declined metformin ER version as well.

## 2020-01-25 NOTE — Patient Instructions (Signed)
It was great seeing you today!  Please check-out at the front desk before leaving the clinic. I'd like to see you back in 1 month but if you need to be seen earlier than that for any new issues we're happy to fit you in, just give [place] a call!  Visit Remembers: - Stop by the pharmacy to pick up your prescriptions  - Continue to work on your healthy eating habits and incorporating exercise into your daily life. - Your goal is to have an BP <1230/80   Regarding lab work today:  Due to recent changes in healthcare laws, you may see the results of your imaging and laboratory studies on MyChart before your provider has had a chance to review them.  I understand that in some cases there may be results that are confusing or concerning to you. Not all laboratory results come back in the same time frame and you may be waiting for multiple results in order to interpret others.  Please give [place] 72 hours in order for your provider to thoroughly review all the results before contacting the office for clarification of your results. If everything is normal, you will get a letter in the mail or a message in My Chart. Please give [place] a call if you do not hear from [place] after 2 weeks.  Please bring all of your medications with you to each visit.    If you haven't already, sign up for My Chart to have easy access to your labs results, and communication with your primary care physician.  Feel free to call with any questions or concerns at any time, at [phone].   Take care,  Dr. Rushie [facility]

## 2020-01-25 NOTE — Progress Notes (Signed)
    Chief Complaint  Patient presents with  . Hypertension    HPI: Stacey Robbins here for follow up.   HTN Taking medications as prescribed. No blurry vision, chest pain, palpitations or shortness of breath.   Morbid Obesity  Patient trying to lose weight. Has followed with dietician in the past but just stopped following up.   HLD Taking medications as prescribed.     PERTINENT  PMH / PSH:  reviewed and updated as appropriate   OBJECTIVE:   BP 126/78   Pulse 93   Wt (!) 309 lb (140.2 kg)   LMP 01/11/2020   SpO2 97%   BMI 54.74 kg/m    GEN: pleasant well developed female, in no acute distress  CV: regular rate and rhythm, no murmurs appreciated  RESP: no increased work of breathing, clear to ascultation bilaterally MSK:  Pedal edema (left), no calf tenderness  SKIN: warm, dry NEURO: moves all extremities appropriately PSYCH: Normal affect, appropriate speech and behavior    ASSESSMENT/PLAN:   Prediabetes    A1c 6.3 and previously 6.4. Fasting CBG 121.  A1C 6.4.  Discussed with patient who is working to lose weight to avoid DM diagnosis. Recommended starting metformin but patient declined this medication as she does not want the GI side effects. Discussed side effects are short term and likely resolve after her body adjusts to the medication. Patient declined metformin ER version as well.   Essential hypertension, benign BP at goal. Refilled HCTZ 25 mg and Losartan 25 mg.  CMP pending.  BMI 50.0-59.9, adult (HCC) Body mass index is 54.74 kg/m.  Considering GLP-1 agonist for metabolic syndrome. Lost 1 lb since last visit in Aug 2021. Counselled her on increasing vegetable intake and decreasing calories obtained by liquids.  She has considered bariatric surgery but wants to lose the weight herself.  - Consider GLP-1 agonist  - Consider referral to healthy weight and wellness clinic   Mixed hyperlipidemia Continue Lipitor 40 mg. Reviewed lipid panel from  12/28/19.     Component Value Date/Time   CHOL 144 12/28/2019 0929   TRIG 94 12/28/2019 0929   HDL 47 12/28/2019 0929   CHOLHDL 3.1 12/28/2019 0929   LDLCALC 79 12/28/2019 0929   LABVLDL 18 12/28/2019 0929           Stacey Hensen, DO PGY-2, [facility] 01/25/2020

## 2020-01-26 ENCOUNTER — Encounter: Payer: Self-pay | Admitting: Family Medicine

## 2020-01-26 LAB — COMPREHENSIVE METABOLIC PANEL
ALT: 28 IU/L (ref 0–32)
AST: 16 IU/L (ref 0–40)
Albumin/Globulin Ratio: 1.3 (ref 1.2–2.2)
Albumin: 4.3 g/dL (ref 3.8–4.8)
Alkaline Phosphatase: 108 IU/L (ref 44–121)
BUN/Creatinine Ratio: 19 (ref 9–23)
BUN: 11 mg/dL (ref 6–24)
Bilirubin Total: 0.2 mg/dL (ref 0.0–1.2)
CO2: 26 mmol/L (ref 20–29)
Calcium: 9.9 mg/dL (ref 8.7–10.2)
Chloride: 98 mmol/L (ref 96–106)
Creatinine, Ser: 0.58 mg/dL (ref 0.57–1.00)
GFR calc Af Amer: 128 mL/min/{1.73_m2} (ref >59)
GFR calc non Af Amer: 111 mL/min/{1.73_m2} (ref >59)
Globulin, Total: 3.4 g/dL (ref 1.5–4.5)
Glucose: 92 mg/dL (ref 65–99)
Potassium: 3.6 mmol/L (ref 3.5–5.2)
Sodium: 140 mmol/L (ref 134–144)
Total Protein: 7.7 g/dL (ref 6.0–8.5)

## 2020-01-26 NOTE — Assessment & Plan Note (Signed)
BP at goal. Refilled HCTZ 25 mg and Losartan 25 mg.  CMP pending.

## 2020-01-26 NOTE — Assessment & Plan Note (Signed)
Body mass index is 54.74 kg/m.  Considering GLP-1 agonist for metabolic syndrome. Lost 1 lb since last visit in Aug 2021. Counselled Stacey Robbins on increasing vegetable intake and decreasing calories obtained by liquids.  She has considered bariatric surgery but wants to lose the weight herself.  - Consider GLP-1 agonist  - Consider referral to healthy weight and wellness clinic

## 2020-01-26 NOTE — Assessment & Plan Note (Signed)
Continue Lipitor 40 mg. Reviewed lipid panel from 12/28/19.     Component Value Date/Time   CHOL 144 12/28/2019 0929   TRIG 94 12/28/2019 0929   HDL 47 12/28/2019 0929   CHOLHDL 3.1 12/28/2019 0929   LDLCALC 79 12/28/2019 0929   LABVLDL 18 12/28/2019 0929

## 2020-02-04 ENCOUNTER — Ambulatory Visit
Admission: RE | Admit: 2020-02-04 | Discharge: 2020-02-04 | Disposition: A | Discharge status: Home / Self Care | Payer: 59 | Source: Ambulatory Visit | Attending: Family Medicine | Admitting: Family Medicine

## 2020-02-04 ENCOUNTER — Other Ambulatory Visit: Payer: Self-pay

## 2020-02-04 DIAGNOSIS — Z1239 Encounter for other screening for malignant neoplasm of breast: Secondary | ICD-10-CM

## 2020-02-05 ENCOUNTER — Encounter: Payer: Self-pay | Admitting: Family Medicine

## 2020-02-08 ENCOUNTER — Other Ambulatory Visit: Payer: Self-pay | Admitting: Family Medicine

## 2020-02-08 DIAGNOSIS — L732 Hidradenitis suppurativa: Secondary | ICD-10-CM | POA: Insufficient documentation

## 2020-02-08 MED ORDER — DOXYCYCLINE HYCLATE 100 MG PO TABS: 100.0000 mg | ORAL_TABLET | Freq: Two times a day (BID) | ORAL | 0 refills | Status: DC

## 2020-02-08 NOTE — Progress Notes (Signed)
Patient reporting HS flare.  Doxycycline 100 mg BID x5 days.  Lyndee Hensen, DO PGY-2, [facility] 02/08/2020 4:27 PM

## 2020-03-27 ENCOUNTER — Encounter: Payer: Self-pay | Admitting: Family Medicine

## 2020-04-07 NOTE — Telephone Encounter (Signed)
Patient calls nurse line to make apt. Patient denies any chest pain, SOB, or left arm weakness. Patient scheduled for tomorrow with [facility] for medication changes.

## 2020-04-08 ENCOUNTER — Ambulatory Visit: Payer: 59 | Admitting: Family Medicine

## 2020-04-08 ENCOUNTER — Encounter: Payer: Self-pay | Admitting: Family Medicine

## 2020-04-08 ENCOUNTER — Other Ambulatory Visit: Payer: Self-pay

## 2020-04-08 VITALS — BP 128/72 | HR 93 | Ht 63.0 in | Wt 314.0 lb

## 2020-04-08 DIAGNOSIS — R002 Palpitations: Secondary | ICD-10-CM | POA: Diagnosis not present

## 2020-04-08 DIAGNOSIS — F419 Anxiety disorder, unspecified: Secondary | ICD-10-CM | POA: Diagnosis not present

## 2020-04-08 MED ORDER — SERTRALINE HCL 50 MG PO TABS: 25.0000 mg | ORAL_TABLET | Freq: Every day | ORAL | 1 refills | Status: DC

## 2020-04-08 NOTE — Assessment & Plan Note (Signed)
Assessment: 49 year old female with recent increase in anxiety due to some family stressors with a GAD-7 score of 7 and a PHQ-9 score of 4.  Patient does not endorse any SI or HI at this time.  She states that she is interested in potentially starting medication to help with her anxiety.  She is also being seen today for palpitations which may be the result of her anxiety as the started around the same time that her anxiety began increasing.  Patient does not endorse any symptoms of panic attacks. Plan: -Discussed with patient the treatment options for anxiety including medications -Patient is interested in starting a medication today so we will start Zoloft at 25 mg/day -Patient plans make a follow-up appointment with either myself or her PCP in the next 1 week to ensure that she is still doing well -If the Zoloft medication resolves her palpitations/improves her anxiety then this further supports the fact that her palpitations are the result of her increase in anxiety.

## 2020-04-08 NOTE — Assessment & Plan Note (Signed)
Assessment: 49 year old female with palpitations which she experienced when she was younger but has now been experiencing them more often over the past 1 to 2 months.  This is coincided with patient starting losartan about 2 months prior as well as a significant increase in stress and anxiety due to some family stressors.  Overall differential can include hypothyroid, electrolyte abnormalities, anxiety, or possibly medication induced though I think this is less likely.  I think the biggest differential at this point is anxiety as the patient endorses significant anxiety coinciding with the increase in palpitations.  Patient is not been experiencing any dizziness, shortness of breath, chest pain with the palpitations and it only seems to occur for 1 or 2 seconds and then quickly resolve.  Patient initially thought this may be related to her losartan but is all right with continuing the medication if provider thinks another cause may be to blame. Plan: -We will check TSH and BMP today -Discussed with patient that I feel she should continue her losartan at this time -Due to a GAD-7 score of 7 we will start Zoloft per patient's request for anxiety.

## 2020-04-08 NOTE — Patient Instructions (Signed)
It was great to see you! Thank you for allowing me to participate in your care!  Our plans for today:  -I believe that the palpitations you are having are most likely not related to the losartan and may be related to anxiety or to electrolyte abnormalities or possibly to your thyroid.  We are checking some lab work today for electrolytes and for your thyroid and I will let you know the results of these when they return. -As discussed due to anxiety from some of the stressors you are experiencing we are starting a medication called Zoloft and a low-dose.  This medication can take 4 to 6 weeks to be fully effective and we can increase the dosage substantially if needed.  I would like for you to make a follow-up appointment in 1 week either with myself or with your primary doctor to ensure that you are still doing well. -I would like for you to continue the losartan at this time as I think it is unlikely that it is because of your palpitations  If you feel any shortness of breath, dizziness, chest pain with your palpitations please call us immediately or go to the emergency department.   We are checking some labs today, I will call you if they are abnormal will send you a MyChart message or a letter if they are normal.  If you do not hear about your labs in the next 2 weeks please let us know.  Take care and seek immediate care sooner if you develop any concerns.   Dr. Lurline Del, [place]

## 2020-04-08 NOTE — Progress Notes (Signed)
SUBJECTIVE:   CHIEF COMPLAINT / HPI:   Palpitations: Patient is a pleasant 49 year old female presents today for medication management after starting losartan.  Since starting the medication the patient has endorsed some feelings of "heart flutters".  Patient currently takes losartan as well as hydrochlorothiazide for blood pressure control.  She also has a history of prediabetes.  She has been taking losartan for several months now but only recently noted the sensation of "heart fluttering".   Has been on it since August, first noticed the heart fluttering about a month ago. Noticed them several times per week and sometimes every day last night she felt them several times throughout the evening. Does not feel short of breath with them, denies chest pain. States they last 1-2 seconds each time and that they feel almost like a "hiccup". Endorses more anxiety lately due to stressors with family.   PERTINENT  PMH / PSH: Hypertension  OBJECTIVE:   BP 128/72   Pulse 93   Ht 5\' 3"  (1.6 m)   Wt (!) 314 lb (142.4 kg)   LMP 01/08/2020   SpO2 98%   BMI 55.62 kg/m      Office Visit from 04/08/2020 in [place]  PHQ-9 Total Score 4     General: NAD, pleasant, able to participate in exam Cardiac: RRR, no murmurs. Respiratory: CTAB, normal effort Skin: warm and dry, no rashes noted Neuro: alert, no obvious focal deficits Psych: Normal affect and mood  ASSESSMENT/PLAN:   Palpitations Assessment: 49 year old female with palpitations which she experienced when she was younger but has now been experiencing them more often over the past 1 to 2 months.  This is coincided with patient starting losartan about 2 months prior as well as a significant increase in stress and anxiety due to some family stressors.  Overall differential can include hypothyroid, electrolyte abnormalities, anxiety, or possibly medication induced though I think this is less likely.  I think the  biggest differential at this point is anxiety as the patient endorses significant anxiety coinciding with the increase in palpitations.  Patient is not been experiencing any dizziness, shortness of breath, chest pain with the palpitations and it only seems to occur for 1 or 2 seconds and then quickly resolve.  Patient initially thought this may be related to her losartan but is all right with continuing the medication if provider thinks another cause may be to blame. Plan: -We will check TSH and BMP today -Discussed with patient that I feel she should continue her losartan at this time -Due to a GAD-7 score of 7 we will start Zoloft per patient's request for anxiety.  Anxiety Assessment: 49 year old female with recent increase in anxiety due to some family stressors with a GAD-7 score of 7 and a PHQ-9 score of 4.  Patient does not endorse any SI or HI at this time.  She states that she is interested in potentially starting medication to help with her anxiety.  She is also being seen today for palpitations which may be the result of her anxiety as the started around the same time that her anxiety began increasing.  Patient does not endorse any symptoms of panic attacks. Plan: -Discussed with patient the treatment options for anxiety including medications -Patient is interested in starting a medication today so we will start Zoloft at 25 mg/day -Patient plans make a follow-up appointment with either myself or her PCP in the next 1 week to ensure that she is still  doing well -If the Zoloft medication resolves her palpitations/improves her anxiety then this further supports the fact that her palpitations are the result of her increase in anxiety.     Lurline Del, [place]    This note was prepared using Dragon voice recognition software and may include unintentional dictation errors due to the inherent limitations of voice recognition software.

## 2020-04-09 LAB — BASIC METABOLIC PANEL
BUN/Creatinine Ratio: 21 (ref 9–23)
BUN: 13 mg/dL (ref 6–24)
CO2: 24 mmol/L (ref 20–29)
Calcium: 9.3 mg/dL (ref 8.7–10.2)
Chloride: 103 mmol/L (ref 96–106)
Creatinine, Ser: 0.62 mg/dL (ref 0.57–1.00)
GFR calc Af Amer: 124 mL/min/{1.73_m2} (ref >59)
GFR calc non Af Amer: 108 mL/min/{1.73_m2} (ref >59)
Glucose: 102 mg/dL — ABNORMAL HIGH (ref 65–99)
Potassium: 4 mmol/L (ref 3.5–5.2)
Sodium: 143 mmol/L (ref 134–144)

## 2020-04-09 LAB — TSH: TSH: 0.68 u[IU]/mL (ref 0.450–4.500)

## 2020-04-14 NOTE — Progress Notes (Signed)
SUBJECTIVE:   CHIEF COMPLAINT / HPI:   Anxiety-follow-up: Patient is a 49 year old female that presents today for follow-up after initiating Zoloft for history of anxiety.  Patient was previously evaluated for concern of palpitations which she thought might be related to her losartan medication, however she had been on the medication for several months before palpitations started in the palpitations occurred in conjunction with major life stressors and a noted increase in the patient anxiety level.  The patient states that since starting Zoloft she feels much better and has had no further bouts of palpitations..  She denies suicidal homicidal ideation.  Palpitations-follow-up: As above the patient endorses no further bouts of palpitations since starting the Zoloft medication.  At her previous appointment she came in for concern of palpitations which she thought may be related to her losartan medication, however she had been on that medication for several months prior to the start of the palpitations and these palpitations started around the time of great family stressors which the patient also believes may be a contributing factor in them.  The patient has no concerns regarding her palpitations today she has not had them since starting the medication.   PERTINENT  PMH / PSH: Anxiety  OBJECTIVE:   BP 130/78   Pulse 97   Ht 5\' 3"  (1.6 m)   Wt (!) 314 lb (142.4 kg)   SpO2 100%   BMI 55.62 kg/m    [employer] Office Visit from 04/15/2020 in [place]  PHQ-9 Total Score 0     GAD 7 : Generalized Anxiety Score 04/15/2020 04/08/2020  Nervous, Anxious, on Edge 0 1  Control/stop worrying 0 1  Worry too much - different things 0 1  Trouble relaxing 0 1  Restless 0 1  Easily annoyed or irritable 0 1  Afraid - awful might happen 0 1  Total GAD 7 Score 0 7  Anxiety Difficulty Not difficult at all Somewhat difficult    General: NAD, pleasant, able to  participate in exam Cardiac: RRR, no murmurs. Respiratory: CTAB, normal effort Psych: Normal affect and mood  ASSESSMENT/PLAN:   Anxiety Assessment: 49 year old female with a recent encounter with myself due to palpitations which we with at that time were related to anxiety.  At that previous encounter about a week ago we started a medication for anxiety, Zoloft at 25 mg/day.  Since that encounter the patient endorses that she has had no further bouts of palpitations and that she feels her anxiety level has improved.  This is supported by the patient's GAD-7 score which has improved to a score of 0 today as well as a PHQ-9 score of 0.  The patient denies any SI or HI symptoms. Plan: -I discussed with patient that this medication will reach maximum efficacy around 4 to 8 weeks. -Discussed that this medication can be associated with thoughts of suicidal ideation and the patient plans to reach out if she experiences any such thoughts, she currently denies them -I discussed with patient that I do recommend she reach out with either me or her PCP around the start of the year which will be about 4 weeks after initiating the medication for consideration of doubling the dose to 50 mg/day which is often a starting dose, to see if this provides her additional benefit. -Patient plans to follow-up with [place] if she has any other concerns.  Palpitations Assessment: Palpitations that I now believe are most likely related to her anxiety as  the patient did have an elevated GAD-7 score at her last appointment and since starting Zoloft the patient has had no further bouts of palpitations.  Her GAD-7 score has also improved which further supports this. Plan: -I discussed with the patient to certainly reach out to [place] if she has any further bouts of palpitations or any other concerning symptoms -Recommended continue the medication, Zoloft, at this time with consideration of increasing it around the start of the year to  see if she gets even more benefit from an anxiety standpoint without medication.     Lurline Del, [place]    This note was prepared using Dragon voice recognition software and may include unintentional dictation errors due to the inherent limitations of voice recognition software.

## 2020-04-15 ENCOUNTER — Other Ambulatory Visit: Payer: Self-pay

## 2020-04-15 ENCOUNTER — Ambulatory Visit (INDEPENDENT_AMBULATORY_CARE_PROVIDER_SITE_OTHER): Payer: 59 | Admitting: Family Medicine

## 2020-04-15 ENCOUNTER — Encounter: Payer: Self-pay | Admitting: Family Medicine

## 2020-04-15 DIAGNOSIS — F419 Anxiety disorder, unspecified: Secondary | ICD-10-CM

## 2020-04-15 DIAGNOSIS — R002 Palpitations: Secondary | ICD-10-CM | POA: Diagnosis not present

## 2020-04-15 NOTE — Assessment & Plan Note (Signed)
Assessment: Palpitations that I now believe are most likely related to her anxiety as the patient did have an elevated GAD-7 score at her last appointment and since starting Zoloft the patient has had no further bouts of palpitations.  Her GAD-7 score has also improved which further supports this. Plan: -I discussed with the patient to certainly reach out to [place] if she has any further bouts of palpitations or any other concerning symptoms -Recommended continue the medication, Zoloft, at this time with consideration of increasing it around the start of the year to see if she gets even more benefit from an anxiety standpoint without medication.

## 2020-04-15 NOTE — Patient Instructions (Signed)
It was great to see you! Thank you for allowing me to participate in your care!  Our plans for today:  -I am glad that the medication is working really well for you!  If you develop any other concerns, any thoughts of harming yourself or anyone else, or if you would like to try increasing the medication do not hesitate to reach out to [place]. -I would like for you to consider reaching out to me or to Dr. Susa Simmonds around the first of the year to discuss potentially doubling the medication, this is still a small dose of this medication and may give you more benefit.  As discussed it usually takes 4 to 8 weeks for this medication to hit maximum efficacy, so you will likely get more benefit between now and the start of the new year than you are having at this time.  I hope you have happy holidays!  Take care and seek immediate care sooner if you develop any concerns.   Dr. Lurline Del, DO [facility]   If you are feeling suicidal or depression symptoms worsen please immediately go to:   [facility]  [address] [place], [place] [phone] Crisis [phone]    . If you are thinking about harming yourself or having thoughts of suicide, or if you know someone who is, seek help right away. . Call your doctor or mental health care provider. . Call 911 or go to a hospital emergency room to get immediate help, or ask a friend or family member to help you do these things. . Call the [place] National Suicide Prevention Lifeline's toll-free, 24-hour hotline at 1-800-273-TALK 208-739-4976) or TTY: 1-800-799-4 TTY 949-050-9544) to talk to a trained counselor. . If you are in crisis, make sure you are not left alone.  . If someone else is in crisis, make sure he or she is not left alone   [facility] [employer] (Domestic Violence, Rape & Victim Assistance [phone]  [facility]    (ONLY  from 8am-4pm)    [phone]  [facility] (24/7)   [phone]  [place] National Suicide Hotline   6158243048 Diamantina Monks)

## 2020-04-15 NOTE — Assessment & Plan Note (Signed)
Assessment: 49 year old female with a recent encounter with myself due to palpitations which we with at that time were related to anxiety.  At that previous encounter about a week ago we started a medication for anxiety, Zoloft at 25 mg/day.  Since that encounter the patient endorses that she has had no further bouts of palpitations and that she feels her anxiety level has improved.  This is supported by the patient's GAD-7 score which has improved to a score of 0 today as well as a PHQ-9 score of 0.  The patient denies any SI or HI symptoms. Plan: -I discussed with patient that this medication will reach maximum efficacy around 4 to 8 weeks. -Discussed that this medication can be associated with thoughts of suicidal ideation and the patient plans to reach out if she experiences any such thoughts, she currently denies them -I discussed with patient that I do recommend she reach out with either me or her PCP around the start of the year which will be about 4 weeks after initiating the medication for consideration of doubling the dose to 50 mg/day which is often a starting dose, to see if this provides her additional benefit. -Patient plans to follow-up with [place] if she has any other concerns.

## 2020-04-15 NOTE — Addendum Note (Signed)
Addended by: Lurline Del on: 04/15/2020 05:22 PM   Modules accepted: Miquel Dunn

## 2020-05-10 ENCOUNTER — Telehealth: Payer: Self-pay | Admitting: *Deleted

## 2020-05-10 NOTE — Telephone Encounter (Signed)
BMI is 55.64- pt has no GI hx- pt will need an OV with Dr Chales Abrahams prior to a colon-   LM to return call to make an OV with Dr Jeannette Corpus PV

## 2020-05-12 NOTE — Telephone Encounter (Signed)
OV 2-8 at 830 am with Dr Chales Abrahams-  Pv andLEC colon canceled

## 2020-05-17 ENCOUNTER — Other Ambulatory Visit: Payer: Self-pay | Admitting: Family Medicine

## 2020-05-17 DIAGNOSIS — I10 Essential (primary) hypertension: Secondary | ICD-10-CM

## 2020-06-09 ENCOUNTER — Encounter: Payer: 59 | Admitting: Gastroenterology

## 2020-06-10 ENCOUNTER — Other Ambulatory Visit: Payer: Self-pay | Admitting: Family Medicine

## 2020-06-10 DIAGNOSIS — L732 Hidradenitis suppurativa: Secondary | ICD-10-CM

## 2020-06-10 DIAGNOSIS — L02224 Furuncle of groin: Secondary | ICD-10-CM

## 2020-06-14 ENCOUNTER — Ambulatory Visit: Payer: 59 | Admitting: Gastroenterology

## 2020-06-14 ENCOUNTER — Encounter: Payer: Self-pay | Admitting: Gastroenterology

## 2020-06-14 VITALS — BP 164/82 | HR 95 | Ht 63.0 in | Wt 321.0 lb

## 2020-06-14 DIAGNOSIS — Z8371 Family history of colonic polyps: Secondary | ICD-10-CM

## 2020-06-14 DIAGNOSIS — Z1211 Encounter for screening for malignant neoplasm of colon: Secondary | ICD-10-CM

## 2020-06-14 NOTE — Patient Instructions (Addendum)
If you are age 48 or older, your body mass index should be between 23-30. Your Body mass index is 56.86 kg/m. If this is out of the aforementioned range listed, please consider follow up with your Primary Care Provider.  If you are age 7 or younger, your body mass index should be between 19-25. Your Body mass index is 56.86 kg/m. If this is out of the aformentioned range listed, please consider follow up with your Primary Care Provider.   You will be due for a recall colonoscopy in June 2022 at [facility]. We will send you a reminder in the mail when it gets closer to that time.  Thank you,  Dr. Jackquline Denmark

## 2020-06-14 NOTE — Progress Notes (Signed)
Chief Complaint: For colonoscopy  Referring Provider:  Lyndee Hensen, DO      ASSESSMENT AND PLAN;   #1. Colorectal cancer screening  #2. FH colon polyps  Plan: -Colon June/july 2022 at [facility]  (d/t BMI>50).   Discussed risks & benefits. Risks including rare perforation req laparotomy, bleeding after bx/polypectomy req blood transfusion, rarely missing neoplasms, risks of anesthesia/sedation. Benefits outweigh the risks. Patient agrees to proceed. All the questions were answered. Consent forms given for review.    HPI:    Stacey Robbins is a 50 y.o. female   No nausea, vomiting, heartburn (better with as needed Tums), regurgitation, odynophagia or dysphagia.  No significant diarrhea or constipation. No melena or hematochezia. No unintentional weight loss. No abdominal pain.  FH polyps- mom at 12  SH-daughter who works with school system. Would be off in June/July. Patient works with [employer] Past Medical History:  Diagnosis Date  . Gestational diabetes   . Hypertension   . Preeclampsia     Past Surgical History:  Procedure Laterality Date  . CESAREAN SECTION     2004  . TUBAL LIGATION     2004    Family History  Problem Relation Age of Onset  . Diabetes Father   . Hypertension Father   . Hypertension Mother   . Breast cancer Maternal Aunt   . Cancer Maternal Aunt        breast cancer   . Brain cancer Maternal Uncle   . Prostate cancer Paternal Grandfather     Social History   Tobacco Use  . Smoking status: Never Smoker  . Smokeless tobacco: Never Used  Vaping Use  . Vaping Use: Never used  Substance Use Topics  . Alcohol use: No  . Drug use: No    Current Outpatient Medications  Medication Sig Dispense Refill  . atorvastatin (LIPITOR) 40 MG tablet TAKE 1 TABLET(40 MG) BY MOUTH DAILY 90 tablet 3  . hydrochlorothiazide (HYDRODIURIL) 25 MG tablet TAKE 1 TABLET(25 MG) BY MOUTH EVERY MORNING 90 tablet 3  . losartan (COZAAR) 25 MG tablet TAKE 1  TABLET(25 MG) BY MOUTH AT BEDTIME 30 tablet 4  . Multiple Vitamins-Minerals (MULTIVITAMIN WITH MINERALS) tablet Take 1 tablet by mouth daily.    . mupirocin ointment (BACTROBAN) 2 % APPLY TOPICALLY TO THE AFFECTED AREA TWICE DAILY 22 g 0  . sertraline (ZOLOFT) 50 MG tablet Take 0.5 tablets (25 mg total) by mouth daily. 30 tablet 1   No current facility-administered medications for this visit.    Allergies  Allergen Reactions  . Lisinopril     cough  . Shrimp [Shellfish Allergy]     And crab meat. Reports itchy throat, nausea, and vomiting.     Review of Systems:  Constitutional: Denies fever, chills, diaphoresis, appetite change and fatigue.  HEENT: Denies photophobia, eye pain, redness, hearing loss, ear pain, congestion, sore throat, rhinorrhea, sneezing, mouth sores, neck pain, neck stiffness and tinnitus.   Respiratory: Denies SOB, DOE, cough, chest tightness,  and wheezing.   Cardiovascular: Denies chest pain, palpitations and leg swelling.  Genitourinary: Denies dysuria, urgency, frequency, hematuria, flank pain and difficulty urinating.  Musculoskeletal: Denies myalgias, back pain, joint swelling, arthralgias and gait problem.  Skin: No rash.  Neurological: Denies dizziness, seizures, syncope, weakness, light-headedness, numbness and headaches.  Hematological: Denies adenopathy. Easy bruising, personal or family bleeding history  Psychiatric/Behavioral: No anxiety or depression     Physical Exam:    BP (!) 164/82 (BP Location: Left  Arm, Patient Position: Sitting, Cuff Size: Large)   Pulse 95   Ht 5\' 3"  (1.6 m)   Wt (!) 321 lb (145.6 kg)   BMI 56.86 kg/m  Wt Readings from Last 3 Encounters:  06/14/20 (!) 321 lb (145.6 kg)  04/15/20 (!) 314 lb (142.4 kg)  04/08/20 (!) 314 lb (142.4 kg)   Constitutional:  Well-developed, in no acute distress. Psychiatric: Normal mood and affect. Behavior is normal. HEENT: Pupils normal.  Conjunctivae are normal. No scleral  icterus. Neck supple.  Cardiovascular: Normal rate, regular rhythm. No edema Pulmonary/chest: Effort normal and breath sounds normal. No wheezing, rales or rhonchi. Abdominal: Soft, nondistended. Nontender. Bowel sounds active throughout. There are no masses palpable. No hepatomegaly. Rectal: Deferred Neurological: Alert and oriented to person place and time. Skin: Skin is warm and dry. No rashes noted.  Data Reviewed: I have personally reviewed following labs and imaging studies  CBC: CBC Latest Ref Rng & Units 07/20/2016 06/15/2013 10/14/2012  WBC 4.0 - 10.5 K/uL 5.9 6.8 6.2  Hemoglobin 12.0 - 15.0 g/dL 12.1 12.5 13.4  Hematocrit 36.0 - 46.0 % 36.9 38.2 39.6  Platelets 150 - 400 K/uL 343 348 361    CMP: CMP Latest Ref Rng & Units 04/08/2020 01/25/2020 12/26/2018  Glucose 65 - 99 mg/dL 102(H) 92 94  BUN 6 - 24 mg/dL 13 11 12   Creatinine 0.57 - 1.00 mg/dL 0.62 0.58 0.57  Sodium 134 - 144 mmol/L 143 140 140  Potassium 3.5 - 5.2 mmol/L 4.0 3.6 3.8  Chloride 96 - 106 mmol/L 103 98 103  CO2 20 - 29 mmol/L 24 26 23   Calcium 8.7 - 10.2 mg/dL 9.3 9.9 9.2  Total Protein 6.0 - 8.5 g/dL - 7.7 -  Total Bilirubin 0.0 - 1.2 mg/dL - 0.2 -  Alkaline Phos 44 - 121 IU/L - 108 -  AST 0 - 40 IU/L - 16 -  ALT 0 - 32 IU/L - 28 -        Radiology Studies: No results found.    Carmell Austria, MD 06/14/2020, 9:06 AM  Cc: Lyndee Hensen, DO

## 2020-08-01 ENCOUNTER — Other Ambulatory Visit: Payer: Self-pay

## 2020-08-01 ENCOUNTER — Ambulatory Visit (INDEPENDENT_AMBULATORY_CARE_PROVIDER_SITE_OTHER): Payer: Self-pay

## 2020-08-01 ENCOUNTER — Telehealth: Payer: Self-pay | Admitting: Family Medicine

## 2020-08-01 DIAGNOSIS — Z111 Encounter for screening for respiratory tuberculosis: Secondary | ICD-10-CM

## 2020-08-01 DIAGNOSIS — Z0110 Encounter for examination of ears and hearing without abnormal findings: Secondary | ICD-10-CM

## 2020-08-01 DIAGNOSIS — Z0100 Encounter for examination of eyes and vision without abnormal findings: Secondary | ICD-10-CM

## 2020-08-01 NOTE — Telephone Encounter (Signed)
LM for patient letting her know that she will need a PPD, vision and hearing per the form she dropped off.  Asked patient to schedule a nurse visit for those.  Form placed back in blue team folder until she makes this appt.  Jazmin Hartsell,CMA

## 2020-08-01 NOTE — Telephone Encounter (Signed)
Patient came into nurse clinic for hearing and vision screening. Normal results on both. PPD placed on 3/28, patient to return to clinic on 3/30 at 3:00 p.m.   Form placed in provider box.   Talbot Grumbling, RN

## 2020-08-01 NOTE — Progress Notes (Signed)
Patient is here for a PPD placement.  PPD placed in left forearm @ 2:50 pm.  Patient will return 08/03/2020 to have PPD read.   Patient also needed hearing and vision for employment form. See hearing and vision flow sheet. Employment form placed in provider box for completion.     Talbot Grumbling, RN

## 2020-08-01 NOTE — Telephone Encounter (Signed)
Health Exam form dropped off for at front desk for completion.  Verified that patient section of form has been completed.  Last DOS/WCC with PCP was 12/28/19 Placed form in team folder to be completed by clinical staff.  Stacey Robbins

## 2020-08-03 ENCOUNTER — Other Ambulatory Visit: Payer: Self-pay

## 2020-08-03 ENCOUNTER — Ambulatory Visit: Payer: Self-pay

## 2020-08-03 DIAGNOSIS — Z111 Encounter for screening for respiratory tuberculosis: Secondary | ICD-10-CM

## 2020-08-03 LAB — TB SKIN TEST
Induration: 0 mm
TB Skin Test: NEGATIVE

## 2020-08-03 NOTE — Progress Notes (Signed)
PPD Reading Note PPD read and results entered in [facility]. Result: 0 mm induration. Interpretation: Negative Allergic reaction: no  Form given to patient with negative Tb result recorded.

## 2020-08-05 ENCOUNTER — Other Ambulatory Visit: Payer: Self-pay | Admitting: Family Medicine

## 2020-09-29 ENCOUNTER — Telehealth: Payer: Self-pay

## 2020-09-29 NOTE — Telephone Encounter (Signed)
LVM for patient to call back regarding Colon at [facility]. Currently the date available is 8-29. Would like to see if she can do this date

## 2020-10-02 ENCOUNTER — Other Ambulatory Visit: Payer: Self-pay | Admitting: Family Medicine

## 2020-10-02 DIAGNOSIS — L732 Hidradenitis suppurativa: Secondary | ICD-10-CM

## 2020-10-02 DIAGNOSIS — L02224 Furuncle of groin: Secondary | ICD-10-CM

## 2020-10-05 ENCOUNTER — Other Ambulatory Visit: Payer: Self-pay | Admitting: Family Medicine

## 2020-10-05 DIAGNOSIS — I10 Essential (primary) hypertension: Secondary | ICD-10-CM

## 2020-10-06 IMAGING — MG MM DIGITAL DIAGNOSTIC BILAT W/ TOMO W/ CAD
8 of 12 series · 8 of 28 positions shown · non-contrast
Comparison: Previous exam(s).

CLINICAL DATA: For follow-up of calcifications in the right breast
as well as annual mammogram.

EXAM:
DIGITAL DIAGNOSTIC BILATERAL MAMMOGRAM WITH CAD AND TOMO

[R CC (1 of 2)]
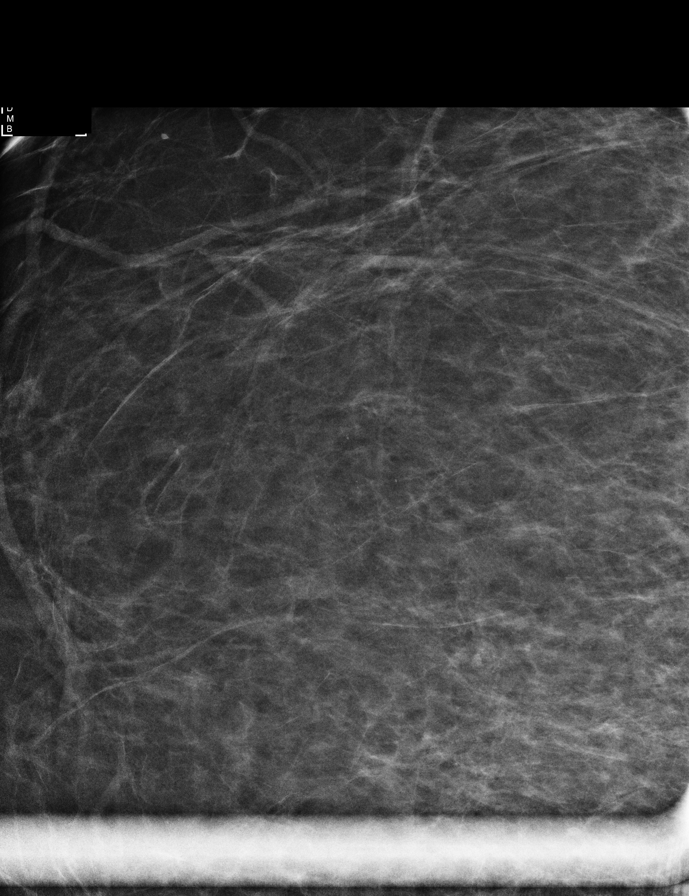

[R ML (1 of 2)]
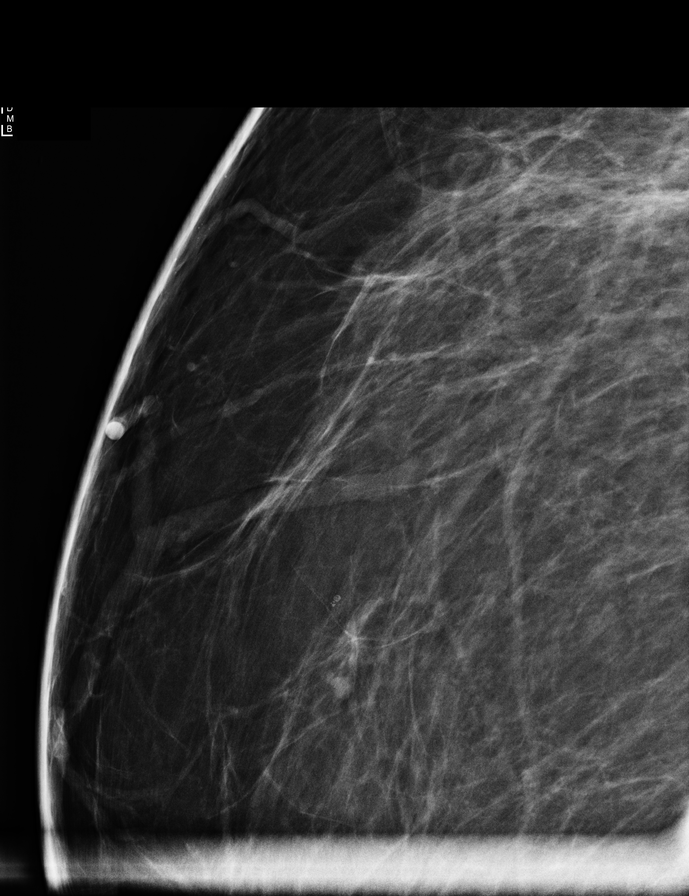

[R ML (2 of 2)]
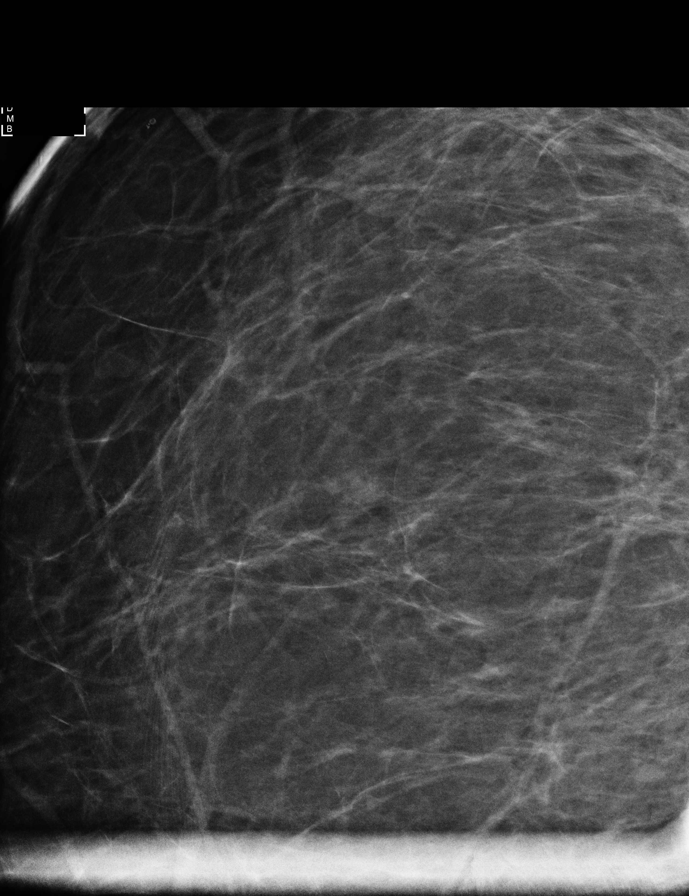

[R CC (2 of 2)]
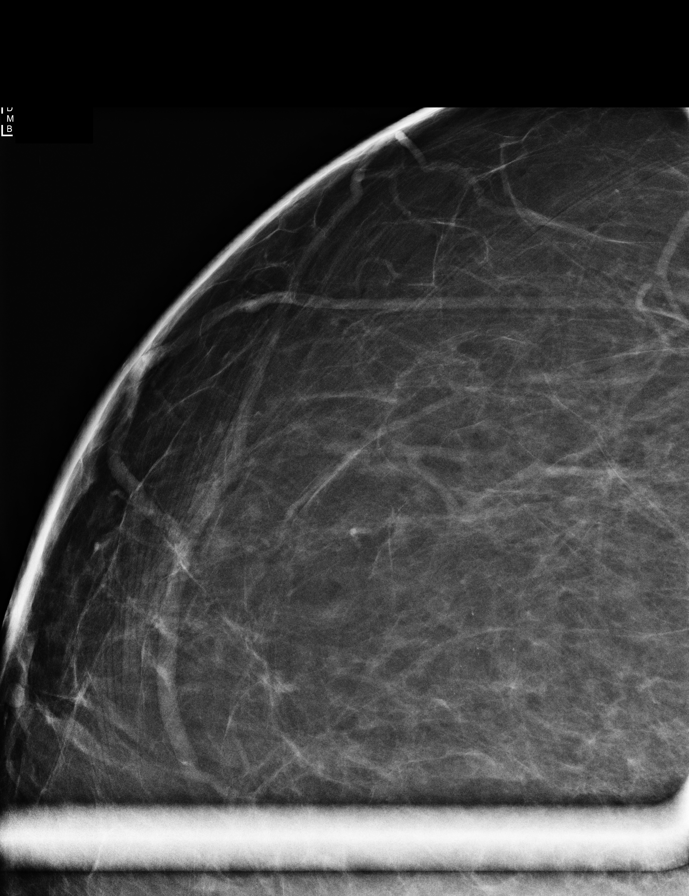

[L MLO synth-2D]
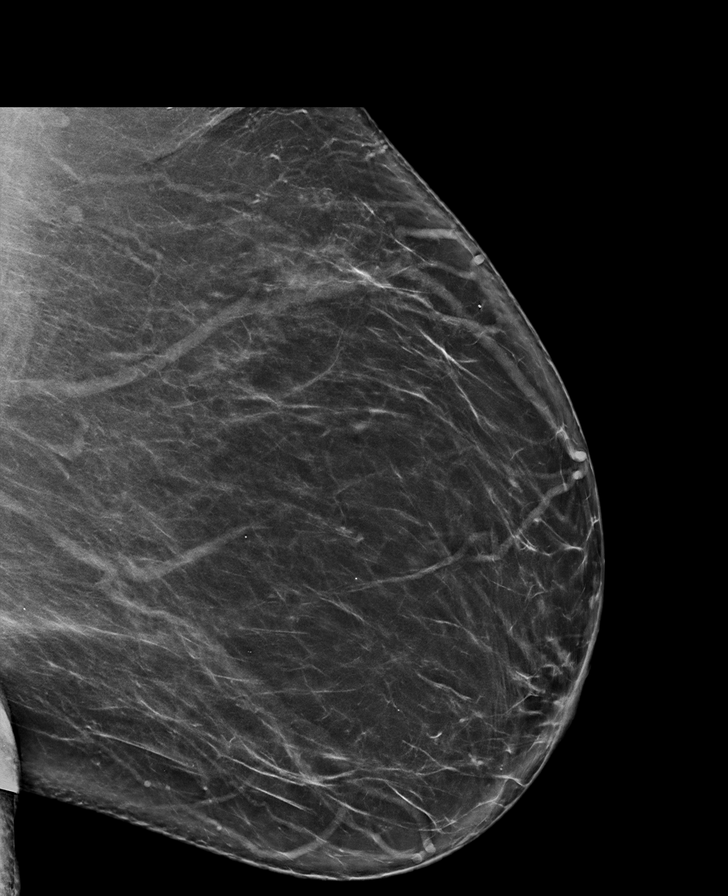

[L CC synth-2D]
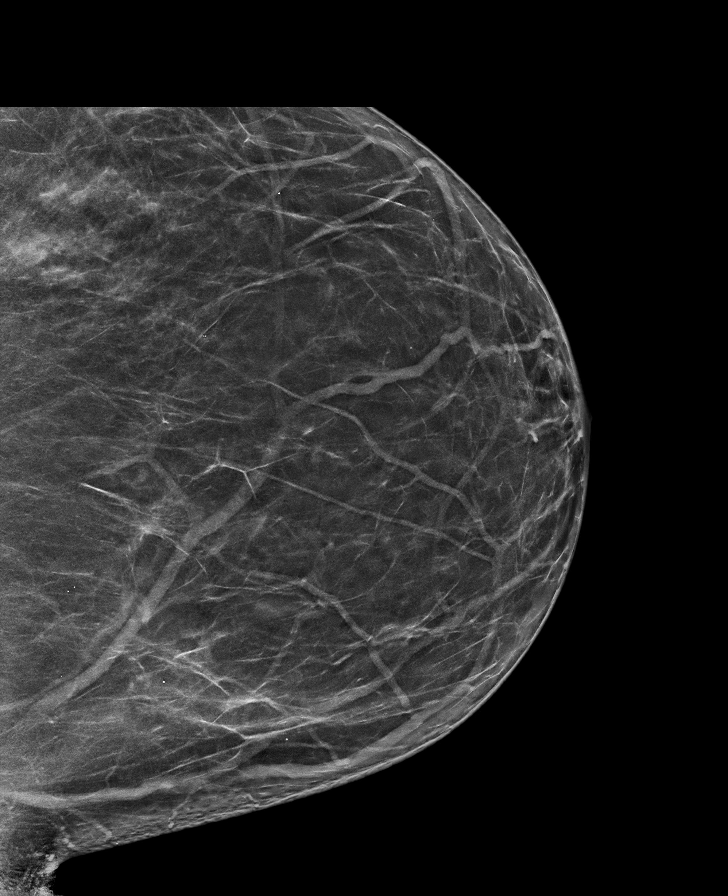

[R CC synth-2D]
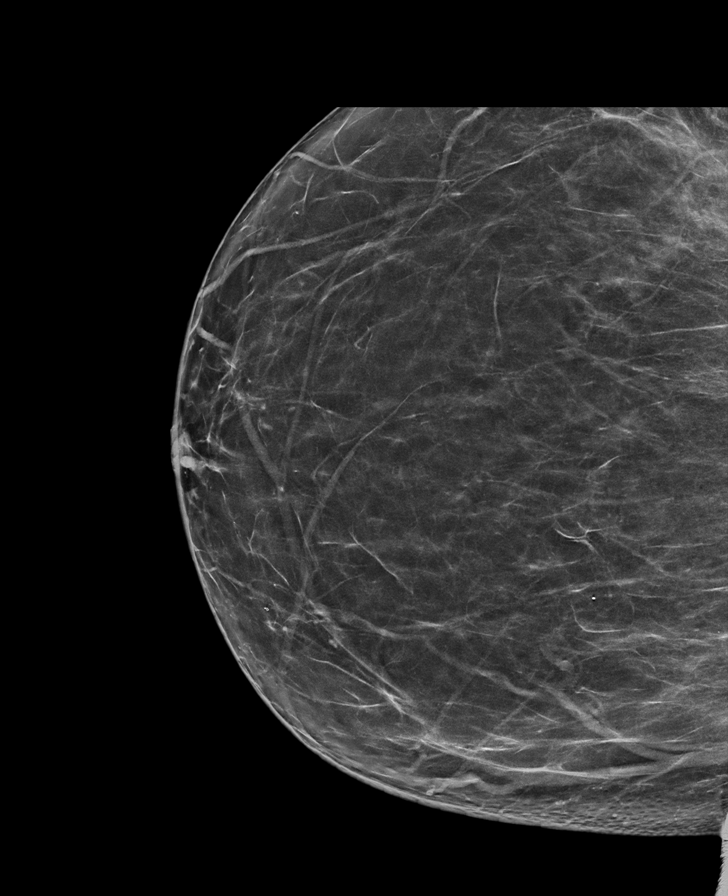

[R MLO synth-2D]
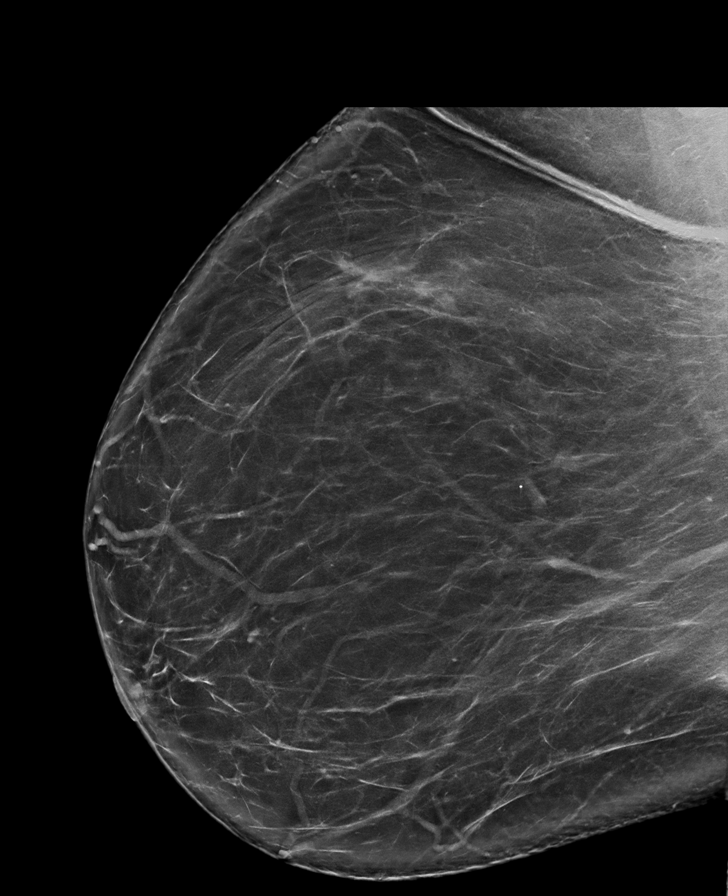

[8 of 28 positions shown; findings below may reference images not displayed]

ACR Breast Density Category b: There are scattered areas of
fibroglandular density.
FINDINGS: Grouped punctate microcalcifications are seen in the skin of the
upper outer right breast, consistent with benign dermal
calcifications. These are not significantly changed since
12/17/2013. No suspicious mass, microcalcification, or other finding
is identified in either breast.

Mammographic images were processed with CAD.
IMPRESSION: Benign dermal calcifications in the right breast. No evidence of
malignancy on either side.

RECOMMENDATION:
Recommend routine annual screening mammogram in 1 year.

I have discussed the findings and recommendations with the patient.
If applicable, a reminder letter will be sent to the patient
regarding the next appointment.

BI-RADS CATEGORY  2: Benign.

## 2020-10-10 ENCOUNTER — Other Ambulatory Visit: Payer: Self-pay

## 2020-10-10 DIAGNOSIS — Z8371 Family history of colonic polyps: Secondary | ICD-10-CM

## 2020-10-10 DIAGNOSIS — Z1211 Encounter for screening for malignant neoplasm of colon: Secondary | ICD-10-CM

## 2020-10-10 NOTE — Telephone Encounter (Signed)
Patient said she cant do 8-29 but she will call back to see about another date. Told her to at least call at the end of this month and the next month to see

## 2020-12-06 ENCOUNTER — Other Ambulatory Visit: Payer: Self-pay | Admitting: Family Medicine

## 2020-12-21 ENCOUNTER — Other Ambulatory Visit: Payer: Self-pay | Admitting: Family Medicine

## 2020-12-27 ENCOUNTER — Ambulatory Visit (INDEPENDENT_AMBULATORY_CARE_PROVIDER_SITE_OTHER): Payer: BC Managed Care – PPO | Admitting: Family Medicine

## 2020-12-27 ENCOUNTER — Other Ambulatory Visit: Payer: Self-pay

## 2020-12-27 ENCOUNTER — Encounter: Payer: Self-pay | Admitting: Family Medicine

## 2020-12-27 VITALS — BP 138/88 | HR 96 | Ht 63.0 in | Wt 318.4 lb

## 2020-12-27 DIAGNOSIS — I10 Essential (primary) hypertension: Secondary | ICD-10-CM | POA: Diagnosis not present

## 2020-12-27 DIAGNOSIS — Z0001 Encounter for general adult medical examination with abnormal findings: Secondary | ICD-10-CM

## 2020-12-27 DIAGNOSIS — L732 Hidradenitis suppurativa: Secondary | ICD-10-CM

## 2020-12-27 DIAGNOSIS — L02224 Furuncle of groin: Secondary | ICD-10-CM | POA: Diagnosis not present

## 2020-12-27 DIAGNOSIS — Z114 Encounter for screening for human immunodeficiency virus [HIV]: Secondary | ICD-10-CM

## 2020-12-27 DIAGNOSIS — Z1159 Encounter for screening for other viral diseases: Secondary | ICD-10-CM

## 2020-12-27 DIAGNOSIS — Z113 Encounter for screening for infections with a predominantly sexual mode of transmission: Secondary | ICD-10-CM

## 2020-12-27 LAB — POCT GLYCOSYLATED HEMOGLOBIN (HGB A1C): HbA1c, POC (prediabetic range): 6.6 % — AB (ref 5.7–6.4)

## 2020-12-27 MED ORDER — MUPIROCIN 2 % EX OINT: TOPICAL_OINTMENT | CUTANEOUS | 0 refills | Status: DC

## 2020-12-27 NOTE — Progress Notes (Signed)
cfgdfg    SUBJECTIVE:   Chief compliant/HPI: annual examination  Stacey Robbins is a 50 y.o. who presents today for an annual exam and to discuss weight loss.  History tabs reviewed and updated.   24 hour recall: 6 AM woke up  6:45 AM drink water  9 AM 8 maple brown sugar old female packet and drink 4 to 6 ounces of orange juice 1 PM 8 a peach with cream cheese and try crackers all 6: Drank 16 ounces of water 3 PM 8 activity a yogurt and drink about 8 ounces of water 6:30 PM grilled chicken, steamed broccoli and a baked potato 9:30 PM went to bed This is a typical day  She tries to get 80 to 120 minutes of exercise per week.    Review of systems form reviewed and notable for left eye discharge.   OBJECTIVE:   BP 138/88   Pulse 96   Ht '5\' 3"'$  (1.6 m)   Wt (!) 318 lb 6.4 oz (144.4 kg)   LMP 12/01/2020   SpO2 98%   BMI 56.40 kg/m    GEN: pleasant well appearing obese female, in no acute distress  CV: regular rate and rhythm, no murmurs appreciated  RESP: no increased work of breathing, clear to ascultation bilaterally MSK: no  LE edema, or cyanosis noted SKIN: warm, dry, mobile indurated area on right inner gluteal cleft  NEURO: grossly normal, moves all extremities appropriately PSYCH: Normal affect, appropriate speech and behavior    ASSESSMENT/PLAN:   No problem-specific Assessment & Plan notes found for this encounter.    Annual Examination  See AVS for age appropriate recommendations.   PHQ score 3, reviewed and discussed.  Blood pressure reviewed and at goal.  Asked about intimate partner violence and resources given as appropriate  The patient currently uses tubal ligation for contraception.   Considered the following items based upon USPSTF recommendations: Diabetes screening: discussed and ordered Screening for elevated cholesterol: discussed and ordered HIV testing: discussed and ordered Hepatitis C: discussed and ordered Hepatitis B: discussed. Pt  had Hep B series.  Syphilis if at high risk: discussed and ordered GC/CT  obtain when pt returns for PAP Reviewed risk factors for latent tuberculosis and not indicated Reviewed risk factors for osteoporosis.  Unable to use FRAX screening tool due to patient's weight.    Cervical cancer screening:  pt to return for PAP Breast cancer screening:  discussed    Maternal aunt had breast cancer in her late 4s which required a double mastectomy. Pt's last mammogram was 02/04/20 and was normal.  Patient to get mammogram in September. Colorectal cancer screening:  discussed, had to cancel her appt with Dr. Lyndel Safe, waiting to reschedule  if age 74 or over.   Follow up in 1 year or sooner if indicated.    Lyndee Hensen, [place], DO PGY-3, [facility] 12/27/2020

## 2020-12-27 NOTE — Patient Instructions (Addendum)
Goals discussed today: Eat Protein source with at least two meals Look at most food labels in the grocery store Walk for 120 minutes a week   Health Maintenance, Female Adopting a healthy lifestyle and getting preventive care are important in promoting health and wellness. Ask your health care provider about: The right schedule for you to have regular tests and exams. Things you can do on your own to prevent diseases and keep yourself healthy. What should I know about diet, weight, and exercise? Eat a healthy diet  Eat a diet that includes plenty of vegetables, fruits, low-fat dairy products, and lean protein. Do not eat a lot of foods that are high in solid fats, added sugars, or sodium.  Maintain a healthy weight Body mass index (BMI) is used to identify weight problems. It estimates body fat based on height and weight. Your health care provider can help determineyour BMI and help you achieve or maintain a healthy weight. Get regular exercise Get regular exercise. This is one of the most important things you can do for your health. Most adults should: Exercise for at least 150 minutes each week. The exercise should increase your heart rate and make you sweat (moderate-intensity exercise). Do strengthening exercises at least twice a week. This is in addition to the moderate-intensity exercise. Spend less time sitting. Even light physical activity can be beneficial. Watch cholesterol and blood lipids Have your blood tested for lipids and cholesterol at 48 years of age, then havethis test every 5 years. Have your cholesterol levels checked more often if: Your lipid or cholesterol levels are high. You are older than 48 years of age. You are at high risk for heart disease. What should I know about cancer screening? Depending on your health history and family history, you may need to have cancer screening at various ages. This may include screening for: Breast cancer. Cervical  cancer. Colorectal cancer. Skin cancer. Lung cancer. What should I know about heart disease, diabetes, and high blood pressure? Blood pressure and heart disease High blood pressure causes heart disease and increases the risk of stroke. This is more likely to develop in people who have high blood pressure readings, are of African descent, or are overweight. Have your blood pressure checked: Every 3-5 years if you are 45-62 years of age. Every year if you are 45 years old or older. Diabetes Have regular diabetes screenings. This checks your fasting blood sugar level. Have the screening done: Once every three years after age 66 if you are at a normal weight and have a low risk for diabetes. More often and at a younger age if you are overweight or have a high risk for diabetes. What should I know about preventing infection? Hepatitis B If you have a higher risk for hepatitis B, you should be screened for this virus. Talk with your health care provider to find out if you are at risk forhepatitis B infection. Hepatitis C Testing is recommended for: Everyone born from [birth year] through 1963. Anyone with known risk factors for hepatitis C. Sexually transmitted infections (STIs) Get screened for STIs, including gonorrhea and chlamydia, if: You are sexually active and are younger than 48 years of age. You are older than 48 years of age and your health care provider tells you that you are at risk for this type of infection. Your sexual activity has changed since you were last screened, and you are at increased risk for chlamydia or gonorrhea. Ask your health care provider if you  are at risk. Ask your health care provider about whether you are at high risk for HIV. Your health care provider may recommend a prescription medicine to help prevent HIV infection. If you choose to take medicine to prevent HIV, you should first get tested for HIV. You should then be tested every 3 months for as long as you are  taking the medicine. Pregnancy If you are about to stop having your period (premenopausal) and you may become pregnant, seek counseling before you get pregnant. Take 400 to 800 micrograms (mcg) of folic acid every day if you become pregnant. Ask for birth control (contraception) if you want to prevent pregnancy. Osteoporosis and menopause Osteoporosis is a disease in which the bones lose minerals and strength with aging. This can result in bone fractures. If you are 51 years old or older, or if you are at risk for osteoporosis and fractures, ask your health care provider if you should: Be screened for bone loss. Take a calcium or vitamin D supplement to lower your risk of fractures. Be given hormone replacement therapy (HRT) to treat symptoms of menopause. Follow these instructions at home: Lifestyle Do not use any products that contain nicotine or tobacco, such as cigarettes, e-cigarettes, and chewing tobacco. If you need help quitting, ask your health care provider. Do not use street drugs. Do not share needles. Ask your health care provider for help if you need support or information about quitting drugs. Alcohol use Do not drink alcohol if: Your health care provider tells you not to drink. You are pregnant, may be pregnant, or are planning to become pregnant. If you drink alcohol: Limit how much you use to 0-1 drink a day. Limit intake if you are breastfeeding. Be aware of how much alcohol is in your drink. In the [place] one drink equals one 12 oz bottle of beer (355 mL), one 5 oz glass of wine (148 mL), or one 1 oz glass of hard liquor (44 mL). General instructions Schedule regular health, dental, and eye exams. Stay current with your vaccines. Tell your health care provider if: You often feel depressed. You have ever been abused or do not feel safe at home. Summary Adopting a healthy lifestyle and getting preventive care are important in promoting health and wellness. Follow  your health care provider's instructions about healthy diet, exercising, and getting tested or screened for diseases. Follow your health care provider's instructions on monitoring your cholesterol and blood pressure. This information is not intended to replace advice given to you by your health care provider. Make sure you discuss any questions you have with your healthcare provider. Document Revised: 04/16/2018 Document Reviewed: 04/16/2018 Elsevier Patient Education  2022 [employer].

## 2020-12-28 LAB — LIPID PANEL
Chol/HDL Ratio: 3.1 ratio (ref 0.0–4.4)
Cholesterol, Total: 151 mg/dL (ref 100–199)
HDL: 49 mg/dL (ref >39)
LDL Chol Calc (NIH): 90 mg/dL (ref 0–99)
Triglycerides: 60 mg/dL (ref 0–149)
VLDL Cholesterol Cal: 12 mg/dL (ref 5–40)

## 2020-12-28 LAB — HEPATITIS C ANTIBODY: Hep C Virus Ab: <0.1 s/co ratio (ref 0.0–0.9)

## 2020-12-28 LAB — RPR: RPR Ser Ql: NONREACTIVE

## 2020-12-28 LAB — BASIC METABOLIC PANEL
BUN/Creatinine Ratio: 15 (ref 9–23)
BUN: 9 mg/dL (ref 6–24)
CO2: 23 mmol/L (ref 20–29)
Calcium: 9.6 mg/dL (ref 8.7–10.2)
Chloride: 102 mmol/L (ref 96–106)
Creatinine, Ser: 0.6 mg/dL (ref 0.57–1.00)
Glucose: 124 mg/dL — ABNORMAL HIGH (ref 65–99)
Potassium: 3.8 mmol/L (ref 3.5–5.2)
Sodium: 141 mmol/L (ref 134–144)
eGFR: 111 mL/min/{1.73_m2} (ref >59)

## 2020-12-28 LAB — HIV ANTIBODY (ROUTINE TESTING W REFLEX): HIV Screen 4th Generation wRfx: NONREACTIVE

## 2020-12-29 ENCOUNTER — Encounter: Payer: Self-pay | Admitting: Family Medicine

## 2020-12-29 NOTE — Progress Notes (Signed)
Called pt to discuss recent A12 and glucose.  She is borderline diabetic.  Fasting glucose does not support this (124) whereas A1C 6.6. Will check in 3 months and start medications if needed at that time.  Pt agrees with plan.    Lyndee Hensen, DO

## 2020-12-30 ENCOUNTER — Other Ambulatory Visit: Payer: Self-pay | Admitting: Family Medicine

## 2020-12-30 DIAGNOSIS — L732 Hidradenitis suppurativa: Secondary | ICD-10-CM

## 2021-01-02 SURGERY — COLONOSCOPY WITH PROPOFOL
Anesthesia: Monitor Anesthesia Care

## 2021-01-25 ENCOUNTER — Ambulatory Visit: Payer: BC Managed Care – PPO | Admitting: Family Medicine

## 2021-02-14 ENCOUNTER — Ambulatory Visit: Payer: BC Managed Care – PPO | Admitting: Family Medicine

## 2021-02-23 ENCOUNTER — Other Ambulatory Visit: Payer: Self-pay | Admitting: Family Medicine

## 2021-02-23 DIAGNOSIS — Z1231 Encounter for screening mammogram for malignant neoplasm of breast: Secondary | ICD-10-CM

## 2021-03-07 ENCOUNTER — Encounter: Payer: Self-pay | Admitting: Family Medicine

## 2021-03-14 ENCOUNTER — Other Ambulatory Visit: Payer: Self-pay | Admitting: Family Medicine

## 2021-03-14 DIAGNOSIS — L732 Hidradenitis suppurativa: Secondary | ICD-10-CM

## 2021-03-14 MED ORDER — DOXYCYCLINE HYCLATE 100 MG PO TABS: 100.0000 mg | ORAL_TABLET | Freq: Two times a day (BID) | ORAL | 0 refills | Status: AC

## 2021-03-24 ENCOUNTER — Ambulatory Visit
Admission: RE | Admit: 2021-03-24 | Discharge: 2021-03-24 | Disposition: A | Discharge status: Home / Self Care | Payer: BC Managed Care – PPO | Source: Ambulatory Visit

## 2021-03-24 ENCOUNTER — Other Ambulatory Visit: Payer: Self-pay

## 2021-03-24 DIAGNOSIS — Z1231 Encounter for screening mammogram for malignant neoplasm of breast: Secondary | ICD-10-CM

## 2021-04-06 ENCOUNTER — Ambulatory Visit: Payer: BC Managed Care – PPO | Admitting: Family Medicine

## 2021-04-27 ENCOUNTER — Other Ambulatory Visit (HOSPITAL_COMMUNITY)
Admission: RE | Admit: 2021-04-27 | Discharge: 2021-04-27 | Disposition: A | Discharge status: Home / Self Care | Payer: BC Managed Care – PPO | Source: Ambulatory Visit | Attending: Family Medicine | Admitting: Family Medicine

## 2021-04-27 ENCOUNTER — Encounter: Payer: Self-pay | Admitting: Family Medicine

## 2021-04-27 ENCOUNTER — Ambulatory Visit (INDEPENDENT_AMBULATORY_CARE_PROVIDER_SITE_OTHER): Payer: BC Managed Care – PPO

## 2021-04-27 ENCOUNTER — Other Ambulatory Visit: Payer: Self-pay

## 2021-04-27 ENCOUNTER — Ambulatory Visit (INDEPENDENT_AMBULATORY_CARE_PROVIDER_SITE_OTHER): Payer: BC Managed Care – PPO | Admitting: Family Medicine

## 2021-04-27 VITALS — BP 158/107 | HR 89 | Ht 63.0 in | Wt 321.4 lb

## 2021-04-27 DIAGNOSIS — I10 Essential (primary) hypertension: Secondary | ICD-10-CM

## 2021-04-27 DIAGNOSIS — Z124 Encounter for screening for malignant neoplasm of cervix: Secondary | ICD-10-CM | POA: Insufficient documentation

## 2021-04-27 DIAGNOSIS — Z23 Encounter for immunization: Secondary | ICD-10-CM

## 2021-04-27 DIAGNOSIS — E1165 Type 2 diabetes mellitus with hyperglycemia: Secondary | ICD-10-CM | POA: Diagnosis not present

## 2021-04-27 DIAGNOSIS — Z0000 Encounter for general adult medical examination without abnormal findings: Secondary | ICD-10-CM

## 2021-04-27 MED ORDER — LOSARTAN POTASSIUM 50 MG PO TABS: 50.0000 mg | ORAL_TABLET | Freq: Every day | ORAL | 0 refills | Status: DC

## 2021-04-27 MED ORDER — OZEMPIC (0.25 OR 0.5 MG/DOSE) 2 MG/1.5ML ~~LOC~~ SOPN: 0.5000 mg | PEN_INJECTOR | SUBCUTANEOUS | 2 refills | Status: DC

## 2021-04-27 NOTE — Assessment & Plan Note (Addendum)
BP not at goal 158/97  She took her HCTZ and takes her losartan at bedtime.  Recheck blood pressure continued to be 158/107.  Increase losartan to 50 mg daily.  Continue HCTZ 25 mg.  Patient to call office with her blood pressures. Future BMP ordered.

## 2021-04-27 NOTE — Patient Instructions (Addendum)
We can send you to a gynecologist to determine if your polyp can be removed.  Let me know if you would like to do this.    Today at your annual preventive visit we talked about the following measures:   I recommend 150 minutes of exercise per week-try 30 minutes 5 days per week We discussed reducing sugary beverages (like soda and juice) and increasing leafy greens and whole fruits.  We discussed avoiding tobacco and alcohol.  I recommend avoiding illicit substances.  Your blood pressure is 158/97 at goal of 120/80. We increased your Losartan to 50 mg daily.    Stop by the pharmacy to pick up your medications.    Follow up with me in 1-2 months   Merry Christmas,  Dr Susa Simmonds

## 2021-04-27 NOTE — Assessment & Plan Note (Addendum)
PAP collected today.  Reviewed Pap in August 2017 that was normal and negative HPV  Advised to schedule colonoscopy.    Mammogram UTD.   Vaccines: Declined influenza vaccine, received COVID booster today.

## 2021-04-27 NOTE — Assessment & Plan Note (Addendum)
Most recent a1c at outside facility >7.  Refilled Ozempic at pt request.

## 2021-04-27 NOTE — Progress Notes (Signed)
° °  SUBJECTIVE:   CHIEF COMPLAINT / HPI:   Chief Complaint  Patient presents with   Gynecologic Exam     Stacey Robbins is a 50 y.o. female here for PAP smear. She has no concerns today. She has had her initial consultation for her colonoscopy but will need to schedule her procedure.  [place] denies smoking, drinking alcohol and illicit drug use.  She feels safe in her relationships.  She had a tubal ligation 18 years ago.   PERTINENT  PMH / PSH: reviewed and updated as appropriate   OBJECTIVE:   BP (!) 158/107    Pulse 89    Ht 5\' 3"  (1.6 m)    Wt (!) 321 lb 6.4 oz (145.8 kg)    SpO2 98%    BMI 56.93 kg/m    GEN: pleasant well appearing female, in no acute distress  CV: regular rate and rhythm RESP: no increased work of breathing, clear to ascultation bilaterally Pelvic exam: normal external genitalia, vulva, VAGINA and CERVIX: lesions absent, no discharge noted, cervical motion tenderness absent, endocervical polyp size 1.5 cm x 1 cm, ADNEXA: normal adnexa in size, nontender and no masses, PAP collected, exam chaperoned by CMA Alexis.     ASSESSMENT/PLAN:   Essential hypertension, benign BP not at goal 158/97  She took her HCTZ and takes her losartan at bedtime.  Recheck blood pressure continued to be 158/107.  Increase losartan to 50 mg daily.  Continue HCTZ 25 mg.  Patient to call office with her blood pressures. Future BMP ordered.  Type 2 diabetes mellitus with hyperglycemia (HCC) Most recent a1c at outside facility >7.  Refilled Ozempic at pt request.   Healthcare maintenance PAP collected today.  Reviewed Pap in August 2017 that was normal and negative HPV  Advised to schedule colonoscopy.    Mammogram UTD.   Vaccines: Declined influenza vaccine, received COVID booster today.  Follow-up in 4 to 6 weeks  Lyndee Hensen, DO PGY-3, [facility] 04/27/2021

## 2021-05-05 ENCOUNTER — Other Ambulatory Visit: Payer: Self-pay

## 2021-05-05 ENCOUNTER — Other Ambulatory Visit: Payer: BC Managed Care – PPO

## 2021-05-05 DIAGNOSIS — I10 Essential (primary) hypertension: Secondary | ICD-10-CM

## 2021-05-05 LAB — CYTOLOGY - PAP
Chlamydia: NEGATIVE
Comment: NEGATIVE
Comment: NEGATIVE
Comment: NEGATIVE
Comment: NORMAL
High risk HPV: NEGATIVE
Neisseria Gonorrhea: NEGATIVE
Trichomonas: NEGATIVE

## 2021-05-06 LAB — BASIC METABOLIC PANEL
BUN/Creatinine Ratio: 17 (ref 9–23)
BUN: 11 mg/dL (ref 6–24)
CO2: 23 mmol/L (ref 20–29)
Calcium: 9.2 mg/dL (ref 8.7–10.2)
Chloride: 105 mmol/L (ref 96–106)
Creatinine, Ser: 0.63 mg/dL (ref 0.57–1.00)
Glucose: 101 mg/dL — ABNORMAL HIGH (ref 70–99)
Potassium: 3.7 mmol/L (ref 3.5–5.2)
Sodium: 142 mmol/L (ref 134–144)
eGFR: 109 mL/min/{1.73_m2} (ref >59)

## 2021-05-11 ENCOUNTER — Ambulatory Visit (INDEPENDENT_AMBULATORY_CARE_PROVIDER_SITE_OTHER): Payer: BC Managed Care – PPO | Admitting: Family Medicine

## 2021-05-11 ENCOUNTER — Other Ambulatory Visit: Payer: Self-pay

## 2021-05-11 VITALS — BP 156/105 | HR 105 | Ht 63.0 in | Wt 318.8 lb

## 2021-05-11 DIAGNOSIS — R000 Tachycardia, unspecified: Secondary | ICD-10-CM

## 2021-05-11 DIAGNOSIS — F419 Anxiety disorder, unspecified: Secondary | ICD-10-CM | POA: Diagnosis not present

## 2021-05-11 DIAGNOSIS — I10 Essential (primary) hypertension: Secondary | ICD-10-CM

## 2021-05-11 DIAGNOSIS — N841 Polyp of cervix uteri: Secondary | ICD-10-CM

## 2021-05-11 DIAGNOSIS — R87619 Unspecified abnormal cytological findings in specimens from cervix uteri: Secondary | ICD-10-CM

## 2021-05-11 NOTE — Progress Notes (Signed)
Patient ID: Stacey Robbins, female   DOB: 11-Aug-1970, 51 y.o.   MRN: [id number]  Chief Complaint  Patient presents with   Abnormal Pap Smear    HPI Stacey Robbins is a 51 y.o. female.    Abnormal PAP: The patient was sent in for colposcopy evaluation for the AGC report of her cervical cytology report. She endorses slight vaginal spotting, which started this morning, but uncertain if related to her regular period.   Anxiety/HTN/Tachycardia: The patient endorses excess anxiety due to the above diagnosis. This is making her heart race and her BP elevated. She denies any chest pain and no SOB. She is compliant with her home BP meds; her last dose was this morning. Her normal home BP readings are usually systolic in the 803O and diastolic in the 12Y. No other concerns. She was placed on Zoloft for anxiety in the past but did not feel it was useful, hence d/ced it.   Indications: Pap smear on December 2023 showed: glandular cell abnormality (AGUS). Previous colposcopy: N/A. Prior cervical treatment: no treatment.   Review of Systems Review of Systems  Genitourinary:        Vaginal spotting  Psychiatric/Behavioral:  Negative for self-injury.        Anxious about her diagnosis  All other systems reviewed and are negative.  Blood pressure (!) 156/105, pulse (!) 105, height 5\' 3"  (1.6 m), weight (!) 318 lb 12.8 oz (144.6 kg), SpO2 99 %.  Physical Exam Physical Exam Vitals and nursing note reviewed. Exam conducted with a chaperone present Jarrett Soho Pipkins).  Genitourinary:    General: Normal vulva.     Vagina: Normal.     Cervix: No discharge.     Comments: Large reddish-wine colored pedunculated cervical polyps.  See media for image Psychiatric:        Mood and Affect: Mood is anxious.     Comments: She became teary during history taking. Worried about her diagnosis. She is anxious to know the treatment plan.    Data Reviewed 05/11/21     Procedure Details  The risks and  benefits of the procedure and Written informed consent obtained.  Speculum placed in vagina.  Cervical exam is unsatisfactory due to large polyps occluding the cervical os and the squamocolumnar junction. Acetic acid and lugols iodine were applied ver the cervix with no abnormal findings. Unable to complete EB due to large polyps/ Gyn referral discussed and she agreed with the plan.  Specimens: N/A  Complications: none.    Assessment/Plan Atypical Glandular Cells  Given her age and weight, Colposcopy and biopsy + ECC + Endometrial biopsy recommended. Unable to complete procedure - see above procedure information  HTN/Tachycardia: Her HR improved. Repeated BP after her procedure increased. I advised her to return to the clinic tomorrow or next week for BP check. Continue home regimen. Red flag signs discussed - headache, vision change, chest pain, e.t.c. She verbalized understanding.  Anxiety: Prefers not to get on Zoloft. F/U soon with PCP discussed. She agreed with the plan.   Cervical polyps: I offered to remove and obtained verbal consent for this. However, she declined. Prefers referral to Gyn for all procedures. Referral placed.     Rayson Rando 05/11/2021, 11:24 AM

## 2021-05-11 NOTE — Patient Instructions (Signed)
It was nice seeing you today. Unfortunately, we were unable to complete the procedures as planned ( Colposcopy with endometrial biopsy) due to the large polyps protruding from your cervix.  We have placed referral to a gynecologist to complete all procedure.  They will call you from their office to schedule.  Please call if experiencing pain or bleeding.  Stay well.

## 2021-05-22 ENCOUNTER — Ambulatory Visit: Payer: BC Managed Care – PPO | Admitting: Family Medicine

## 2021-05-29 ENCOUNTER — Other Ambulatory Visit: Payer: Self-pay

## 2021-05-29 ENCOUNTER — Encounter: Payer: Self-pay | Admitting: Family Medicine

## 2021-05-29 ENCOUNTER — Ambulatory Visit (INDEPENDENT_AMBULATORY_CARE_PROVIDER_SITE_OTHER): Payer: BC Managed Care – PPO | Admitting: Family Medicine

## 2021-05-29 VITALS — BP 148/96 | HR 96 | Ht 63.0 in | Wt 320.2 lb

## 2021-05-29 DIAGNOSIS — I10 Essential (primary) hypertension: Secondary | ICD-10-CM

## 2021-05-29 DIAGNOSIS — L732 Hidradenitis suppurativa: Secondary | ICD-10-CM | POA: Diagnosis not present

## 2021-05-29 DIAGNOSIS — E1165 Type 2 diabetes mellitus with hyperglycemia: Secondary | ICD-10-CM

## 2021-05-29 DIAGNOSIS — Z6841 Body Mass Index (BMI) 40.0 and over, adult: Secondary | ICD-10-CM | POA: Diagnosis not present

## 2021-05-29 DIAGNOSIS — M79652 Pain in left thigh: Secondary | ICD-10-CM

## 2021-05-29 MED ORDER — OZEMPIC (1 MG/DOSE) 4 MG/3ML ~~LOC~~ SOPN: 1.0000 mg | PEN_INJECTOR | SUBCUTANEOUS | 3 refills | Status: DC

## 2021-05-29 MED ORDER — LOSARTAN POTASSIUM 100 MG PO TABS: 100.0000 mg | ORAL_TABLET | Freq: Every day | ORAL | 3 refills | Status: DC

## 2021-05-29 MED ORDER — DICLOFENAC SODIUM 75 MG PO TBEC: 75.0000 mg | DELAYED_RELEASE_TABLET | Freq: Two times a day (BID) | ORAL | 0 refills | Status: DC

## 2021-05-29 MED ORDER — DOXYCYCLINE HYCLATE 50 MG PO CAPS: 50.0000 mg | ORAL_CAPSULE | Freq: Two times a day (BID) | ORAL | 0 refills | Status: DC

## 2021-05-29 NOTE — Assessment & Plan Note (Signed)
Current flare. Discussed prophylaxis and she is agreeable. Start 50 mg doxycycline BID.

## 2021-05-29 NOTE — Assessment & Plan Note (Signed)
BP not at goal, initial 160/94.  She reports adherance to medications. Recheck after 10-15 minutes was 148/96.  Increase Losartan to 100 mg daily. Continue HCTZ 25 mg.  Follow up next month as scheduled. Future BMP ordered.

## 2021-05-29 NOTE — Progress Notes (Signed)
° °  SUBJECTIVE:   CHIEF COMPLAINT / HPI:   Chief Complaint  Patient presents with   Blood Pressure Check   Knee Pain     Stacey Robbins is a 51 y.o. female here for blood pressure follow up and leg pain.    HTN Takes Losartan and HCTZ. Denies missing doses of antihypertensive medications. Denies chest pain, palpitations, lower extremity edema, exertional dyspnea, lightheadedness, headaches and vision changes.  BP at home ranges 143 - 117 / 90-63.    Leg Pain Has left thigh pain radiates to her knee for the past week. Pain started after getting up at work. She works out 5 days a week (stationary jogging). She took Alleve with some relief.  Pulling like pain described as "like a strained muscle." Denies trauma or injury.    Hidradenitis suppuratia Flares are getting more frequent. Currently have bilateral axillary boils. She doesn't feel as if the antibiotic ointment is working.    PERTINENT  PMH / PSH: reviewed and updated as appropriate   OBJECTIVE:   BP (!) 148/96    Pulse 96    Ht 5\' 3"  (1.6 m)    Wt (!) 320 lb 3.2 oz (145.2 kg)    SpO2 100%    BMI 56.72 kg/m    GEN: pleasant well appearing female, in no acute distress  CV: regular rate and rhythm, no murmurs appreciated  RESP: no increased work of breathing, clear to ascultation bilaterally SKIN: warm, dry, no rash on visible skin MSK: thigh non-tender to palpation, knee exam -Inspection: no deformity, no discoloration -Palpation: no joint line tenderness -ROM: Extension: 0 degrees; Flexion: 140 degrees.   -Strength 5/5  -Special Tests: Varus Stress: Negative; Valgus Stress: Negative; Anterior drawer: Negative; Posterior drawer: Negative -Limb neurovascularly intact, no instability noted   ASSESSMENT/PLAN:   Essential hypertension, benign BP not at goal, initial 160/94.  She reports adherance to medications. Recheck after 10-15 minutes was 148/96.  Increase Losartan to 100 mg daily. Continue HCTZ 25 mg.  Follow up  next month as scheduled. Future BMP ordered.   Type 2 diabetes mellitus with hyperglycemia ([place]) Takes on Wednesdays. Increase Ozempic to 1 mg weekly. She has not had any weight loss since starting Ozempic. Body mass index is 56.72 kg/m.   Hidradenitis suppurativa Current flare. Discussed prophylaxis and she is agreeable. Start 50 mg doxycycline BID.    Thigh Pain Pt with one week of thigh pain. Suspect muscle strain. Start Voltaren BID PRN.      Lyndee Hensen, DO PGY-3, [facility] 05/29/2021

## 2021-05-29 NOTE — Patient Instructions (Signed)
For your blood pressure: Take 2-50 mg tablets until your finish your home supply.  Stop by the pharmacy to pick up your new 100 mg tablets.  Follow up with me in February.   Fore your knee pain: Pick up the pain medication, Voltaren tablets. Do not take other over-the-counter pain medications unless it's tylenol.   For your boils: Take 50 mg twice day to prevent reoccurring boils   We increased your Ozempic to 1 mg weekly.     Follow up with me next month.   Take Care,   Dr Susa Simmonds

## 2021-05-29 NOTE — Assessment & Plan Note (Addendum)
Takes on Wednesdays. Increase Ozempic to 1 mg weekly. She has not had any weight loss since starting Ozempic. Body mass index is 56.72 kg/m.

## 2021-06-27 ENCOUNTER — Encounter: Payer: Self-pay | Admitting: Family Medicine

## 2021-06-27 ENCOUNTER — Other Ambulatory Visit: Payer: Self-pay

## 2021-06-27 ENCOUNTER — Ambulatory Visit: Payer: BC Managed Care – PPO | Admitting: Family Medicine

## 2021-06-27 VITALS — BP 144/72 | HR 92 | Ht 63.0 in | Wt 314.1 lb

## 2021-06-27 DIAGNOSIS — I10 Essential (primary) hypertension: Secondary | ICD-10-CM | POA: Diagnosis not present

## 2021-06-27 DIAGNOSIS — Z794 Long term (current) use of insulin: Secondary | ICD-10-CM

## 2021-06-27 DIAGNOSIS — F419 Anxiety disorder, unspecified: Secondary | ICD-10-CM

## 2021-06-27 DIAGNOSIS — Z0000 Encounter for general adult medical examination without abnormal findings: Secondary | ICD-10-CM

## 2021-06-27 DIAGNOSIS — E1165 Type 2 diabetes mellitus with hyperglycemia: Secondary | ICD-10-CM | POA: Diagnosis not present

## 2021-06-27 LAB — POCT GLYCOSYLATED HEMOGLOBIN (HGB A1C): HbA1c, POC (controlled diabetic range): 5.7 % (ref 0.0–7.0)

## 2021-06-27 NOTE — Progress Notes (Signed)
° °  SUBJECTIVE:   CHIEF COMPLAINT / HPI:   Chief Complaint  Patient presents with   Follow-up    BP     Stacey Robbins is a 51 y.o. female here for BP and diabetes follow up.    Diabetes Mellitus  Takes Ozempic on Wednesdays.  She walks and perform aerobic exercises about 5 days a week. She has been watching what she eats.   HTN Takes Losartan/HCTZ and Losartan . Denies missing doses of antihypertensive medications. Denies chest pain, palpitations, lower extremity edema, exertional dyspnea, lightheadedness, headaches and vision changes. Notes that her family member had a CVA this morning and this has caused increased life stress today.  Exercises:  moderately active Low salt diet: no    Anxiety  Feels like her anxiety symptoms are under control. She has stopped taking her Zoloft.       PERTINENT  PMH / PSH: reviewed and updated as appropriate   OBJECTIVE:   BP (!) 144/72 Comment: seated, manual BP   Pulse 92    Ht 5\' 3"  (1.6 m)    Wt (!) 314 lb 2 oz (142.5 kg)    SpO2 98%    BMI 55.64 kg/m     GEN: pleasant well appearing female, in no acute distress  CV: regular rate and rhythm, no murmurs appreciated  RESP: no increased work of breathing, clear to ascultation bilaterally SKIN: warm, dry PSYCH: Normal affect, appropriate speech and behavior    ASSESSMENT/PLAN:   Anxiety Stable without medication. Discontinue Zoloft.   Type 2 diabetes mellitus with hyperglycemia (HCC) Continue current regimen:  Ozempic 1 mg weekly.  A1c today 5.7 and previously 6.6. Encouraged continued diet rich in vegetables and complex carbs.  Heart healthy carb modified diet. Counseled on need to continue exercising.   Statin therapy: Lipitor  ACEi/ARB: Losartan  Urine microalbumine: collected today  Eye exam: [facility] in [place], report of healthy eyes   Essential hypertension, benign Suspect acute stress increasing her BP given she was just told about her nephew's CVA today.  Advised patient to keep BP log at home and send in values in about 1-2 weeks.  Medications: Losartan 100 mg / HCTZ 25 mg  Exercise: Encouraged to increase physical activity as tolerated.  Diet Pattern: Heart healthy dietary choices discussed.      Healthcare Maintenance  Colonoscopy discussed and patient needs to reschedule her colonoscopy.   Morbid Obesity  Body mass index is 55.64 kg/m.  Taking Ozempic. Lost 6 lbs since last visit (previously 320 lb; 145.2 kg) while taking Ozempic and exercising.     Lyndee Hensen, DO PGY-3, [facility] 06/27/2021

## 2021-06-27 NOTE — Patient Instructions (Addendum)
Be sure to call to reschedule your colonoscopy after your gynecology procedure.   For the next 7-10 days: document your blood pressure in the morning and send me the picture of it. This will help me determine if your BP is actually high or due to stress.   I'll let you know about your protein urine sample. I will let you know.   Take Care  Dr Susa Simmonds

## 2021-06-27 NOTE — Assessment & Plan Note (Signed)
Stable without medication. Discontinue Zoloft.

## 2021-06-28 LAB — MICROALBUMIN / CREATININE URINE RATIO
Creatinine, Urine: 212.3 mg/dL
Microalb/Creat Ratio: 15 mg/g creat (ref 0–29)
Microalbumin, Urine: 31.5 ug/mL

## 2021-06-29 ENCOUNTER — Other Ambulatory Visit: Payer: Self-pay

## 2021-06-29 ENCOUNTER — Other Ambulatory Visit (HOSPITAL_COMMUNITY)
Admission: RE | Admit: 2021-06-29 | Discharge: 2021-06-29 | Disposition: A | Discharge status: Home / Self Care | Payer: BC Managed Care – PPO | Source: Ambulatory Visit | Attending: Obstetrics & Gynecology | Admitting: Obstetrics & Gynecology

## 2021-06-29 ENCOUNTER — Encounter: Payer: Self-pay | Admitting: Family Medicine

## 2021-06-29 ENCOUNTER — Ambulatory Visit (HOSPITAL_BASED_OUTPATIENT_CLINIC_OR_DEPARTMENT_OTHER): Payer: BC Managed Care – PPO | Admitting: Obstetrics & Gynecology

## 2021-06-29 ENCOUNTER — Encounter (HOSPITAL_BASED_OUTPATIENT_CLINIC_OR_DEPARTMENT_OTHER): Payer: Self-pay | Admitting: Obstetrics & Gynecology

## 2021-06-29 VITALS — BP 150/102 | HR 101 | Ht 63.0 in | Wt 313.8 lb

## 2021-06-29 DIAGNOSIS — R87619 Unspecified abnormal cytological findings in specimens from cervix uteri: Secondary | ICD-10-CM | POA: Diagnosis present

## 2021-06-29 DIAGNOSIS — N926 Irregular menstruation, unspecified: Secondary | ICD-10-CM | POA: Diagnosis not present

## 2021-06-29 DIAGNOSIS — Z6841 Body Mass Index (BMI) 40.0 and over, adult: Secondary | ICD-10-CM | POA: Diagnosis not present

## 2021-06-29 NOTE — Assessment & Plan Note (Signed)
Suspect acute stress increasing her BP given she was just told about her nephew's CVA today. Advised patient to keep BP log at home and send in values in about 1-2 weeks.  Medications: Losartan 100 mg / HCTZ 25 mg  Exercise: Encouraged to increase physical activity as tolerated.  Diet Pattern: Heart healthy dietary choices discussed.

## 2021-06-29 NOTE — Assessment & Plan Note (Signed)
Continue current regimen:  Ozempic 1 mg weekly.  A1c today 5.7 and previously 6.6. Encouraged continued diet rich in vegetables and complex carbs.  Heart healthy carb modified diet. Counseled on need to continue exercising.   Statin therapy: Lipitor  ACEi/ARB: Losartan  Urine microalbumine: collected today  Eye exam: Myeyed[facility]ctor in [place], report of healthy eyes

## 2021-06-29 NOTE — Progress Notes (Signed)
51 y.o. [id number] Divorced AA female here for new patient appt/referral from Dr. Gwendlyn Deutscher for additional evaluation of AGUS pap smear obtained 04/27/2021.  HR HPV was negative.  Pt does not have hx of recurrent abnormal pap smears.  She does have hx of irregular menstrual cycles.  Typically will have menstrual bleeding about every 3-4 months.  Does have intermenstrual spotting as well.  H/O BTL with cesarean section in 2004.  Is SA.  Denies vaginal discharge or odor.  Was advised she has cervical polyps that need to be managed as well.  This was noted with visit on 05/11/2021.  D/w pt need for colposcopy, ECC and endometrial biopsy today given AGUS pap smear and irregular bleeding.  Patient has been counseled about results and procedure.  Risks and benefits have bene reviewed including immediate and/or delayed bleeding, infection, cervical scaring from procedure, possibility of needing additional follow up as well as treatment.  Rare risks of missing a lesion discussed as well.  All questions answered.  Pt ready to proceed.  Consent obtained.  BP (!) 150/102 (BP Location: Right Arm, Patient Position: Sitting, Cuff Size: Large)    Pulse (!) 101    Ht 5\' 3"  (1.6 m) Comment: reported   Wt (!) 313 lb 12.8 oz (142.3 kg)    BMI 55.59 kg/m              Physical Exam Constitutional:      Appearance: She is obese.  Genitourinary:    General: Normal vulva.     Labia:        Right: No lesion.        Left: No lesion.      Vagina: Normal.     Cervix: Normal.     Lymphadenopathy:     Lower Body: No right inguinal adenopathy. No left inguinal adenopathy.  Neurological:     General: No focal deficit present.     Mental Status: She is alert.  Psychiatric:        Mood and Affect: Mood normal.    Speculum placed.  3% acetic acid applied to cervix for >45 seconds.  Cervix visualized with both 7.5X and 15X magnification.  Green filter also used.  Lugols solution was not used.  Findings:  No AWE noted.  Polypoid  endocervical tissue noted but no actual polyps.  This was not removed.  ECC was obtained.  Then cervix cleansed with Hibiclens x 3.  Anterior lip of cervix grasped with single toothed tenaculum.  Endometrial pipelle passed into endometrial cavity and good tissue obtained with single pass.  Minimal bleeding noted after biopsy obtained.  Tenaculum removed.  Again, minimal bleeding noted.  Pt tolerated procedure well and all instruments were removed.  Findings noted above on picture of cervix.  Chaperone, Octaviano Batty, CMA, was present during procedure.  Assessment/Plan: 1. 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  2. Irregular menstrual bleeding - would recommend treatment to decrease risk of endometrial abnormality for pain but will await biopsy results first  3. BMI 50.0-59.9, adult ([place])

## 2021-06-30 ENCOUNTER — Encounter (HOSPITAL_BASED_OUTPATIENT_CLINIC_OR_DEPARTMENT_OTHER): Payer: Self-pay | Admitting: Obstetrics & Gynecology

## 2021-07-03 LAB — SURGICAL PATHOLOGY

## 2021-07-04 ENCOUNTER — Telehealth (HOSPITAL_BASED_OUTPATIENT_CLINIC_OR_DEPARTMENT_OTHER): Payer: Self-pay

## 2021-07-04 NOTE — Telephone Encounter (Signed)
Called and spoke with patient about lab results and recommendations. Patient expresses understanding and will schedule an appointment in 6 months for repeat pap. tbw

## 2021-07-06 ENCOUNTER — Encounter: Payer: Self-pay | Admitting: Family Medicine

## 2021-07-21 ENCOUNTER — Other Ambulatory Visit: Payer: Self-pay | Admitting: Family Medicine

## 2021-07-21 DIAGNOSIS — L02224 Furuncle of groin: Secondary | ICD-10-CM

## 2021-07-21 DIAGNOSIS — L732 Hidradenitis suppurativa: Secondary | ICD-10-CM

## 2021-07-21 MED ORDER — MUPIROCIN 2 % EX OINT: TOPICAL_OINTMENT | CUTANEOUS | 0 refills | Status: DC

## 2021-07-25 ENCOUNTER — Other Ambulatory Visit: Payer: Self-pay | Admitting: Family Medicine

## 2021-07-25 DIAGNOSIS — I10 Essential (primary) hypertension: Secondary | ICD-10-CM

## 2021-07-25 MED ORDER — LOSARTAN POTASSIUM 100 MG PO TABS: 100.0000 mg | ORAL_TABLET | Freq: Every day | ORAL | 3 refills | Status: DC

## 2021-07-25 MED ORDER — HYDROCHLOROTHIAZIDE 25 MG PO TABS: ORAL_TABLET | ORAL | 3 refills | Status: DC

## 2021-08-16 ENCOUNTER — Other Ambulatory Visit: Payer: Self-pay | Admitting: Family Medicine

## 2021-08-16 DIAGNOSIS — L732 Hidradenitis suppurativa: Secondary | ICD-10-CM

## 2021-09-03 ENCOUNTER — Encounter: Payer: Self-pay | Admitting: Family Medicine

## 2021-09-04 ENCOUNTER — Other Ambulatory Visit: Payer: Self-pay | Admitting: Family Medicine

## 2021-09-04 DIAGNOSIS — E1165 Type 2 diabetes mellitus with hyperglycemia: Secondary | ICD-10-CM

## 2021-09-04 MED ORDER — OZEMPIC (2 MG/DOSE) 8 MG/3ML ~~LOC~~ SOPN: 2.0000 mg | PEN_INJECTOR | SUBCUTANEOUS | 3 refills | Status: DC

## 2021-10-10 ENCOUNTER — Encounter: Payer: Self-pay | Admitting: Family Medicine

## 2021-10-10 ENCOUNTER — Other Ambulatory Visit: Payer: Self-pay | Admitting: Family Medicine

## 2021-10-10 ENCOUNTER — Encounter: Payer: Self-pay | Admitting: *Deleted

## 2021-10-10 DIAGNOSIS — L02224 Furuncle of groin: Secondary | ICD-10-CM

## 2021-10-10 DIAGNOSIS — L732 Hidradenitis suppurativa: Secondary | ICD-10-CM

## 2021-10-11 ENCOUNTER — Other Ambulatory Visit: Payer: Self-pay | Admitting: Family Medicine

## 2021-10-11 DIAGNOSIS — L02224 Furuncle of groin: Secondary | ICD-10-CM

## 2021-10-11 DIAGNOSIS — L732 Hidradenitis suppurativa: Secondary | ICD-10-CM

## 2021-10-11 MED ORDER — MUPIROCIN 2 % EX OINT: TOPICAL_OINTMENT | CUTANEOUS | 0 refills | Status: DC

## 2021-12-16 ENCOUNTER — Other Ambulatory Visit: Payer: Self-pay | Admitting: Family Medicine

## 2021-12-28 NOTE — Progress Notes (Signed)
SUBJECTIVE:   CHIEF COMPLAINT / HPI:   Shortness of BreathCough For the past week whenever she is sitting and talking she has had to catch breath and starts coughing. Does have hiccups every day. Not exacerbated by anything. No fever/nasal drainage. Not productive. No chest pain. Mostly when sitting up not when laying down.  Does have some midline epigastric pain intermittently.  Denies any sore throat. LE Swelling comes and goes for years.  No hemoptysis no sick contacts. No smoking history  Headache Sides of temples squeezes tight for about 1 week. No eye pain or vision changes. During daytime mostly. Takes OTC excedrin migraine and that helps it. Feels different than previous migraines. No vomiting, just nauseous sometimes.  Diabetic Follow Up: Patient is a 51 y.o. female who present today for diabetic follow up.   Patient endorses no problems  Home medications include: Ozempic 2 mg weekly ACEi/ARB: yes Statin: yes Patient endorses taking these medications as prescribed. Would be interested in [place].  Most recent A1Cs:  Lab Results  Component Value Date   HGBA1C 5.7 12/29/2021   HGBA1C 5.7 06/27/2021   HGBA1C 6.6 (A) 12/27/2020   Last Microalbumin, LDL, Creatinine: Lab Results  Component Value Date   LDLCALC 90 12/27/2020   CREATININE 0.63 05/05/2021   Patient does not check blood glucose on a regular basis.  Hypertension: Patient is a 51 y.o. female who present today for follow up of hypertension.   Patient endorses no problems  Home medications include: losartan and HCTZ Patient endorses taking these medications as prescribed. Has headache (acute), shortness of breath (acute), lower extremity swelling (longterm). No chest pain or vision changes.  Most recent creatinine trend:  Lab Results  Component Value Date   CREATININE 0.63 05/05/2021   CREATININE 0.60 12/27/2020   CREATININE 0.62 04/08/2020   Patient does not check blood pressure at  home.  Patient has not had a BMP in the past 1 year.   PERTINENT  PMH / PSH:   OBJECTIVE:   BP (!) 152/105   Pulse 96   Ht '5\' 3"'$  (1.6 m)   Wt (!) 306 lb 9.6 oz (139.1 kg)   SpO2 100%   BMI 54.31 kg/m   General: NAD, awake, alert, responsive to questions, obese habitus Head: Normocephalic atraumatic, temples non-tender CV: Regular rate and rhythm no murmurs rubs or gallops Respiratory: Clear to ausculation bilaterally, no wheezes rales or crackles, chest rises symmetrically,  no increased work of breathing Abdomen: Soft, non-tender Extremities: Moves extremities freely, trace nonpitting edema in lower extremity, no calf tenderness Neuro: CN II: PERRL CN III, IV,VI: EOMI CV V: Normal sensation in V1, V2, V3 CVII: Symmetric smile and brow raise CN VIII: Normal hearing CN IX,X: Symmetric palate raise  CN XI: 5/5 shoulder shrug CN XII: Symmetric tongue protrusion  UE and LE strength 5/5 2+ UE and LE reflexes  Normal sensation in UE and LE bilaterally  No ataxia with finger to nose  ASSESSMENT/PLAN:   Acute cough Patient has been having a dry cough since Monday. No sick symptoms, less likely to be related to cold. Not on ACE inhibitor. No chest pain or hemoptysis and no tachycardia-unlikely to be PE. No wheezing on exam and no nocturnal cough less likely to be asthma. DDx likely allergy sxs vs GERD. SOB likely component of morbid obesity as well - reassuring lung exam and generally patient appeared well. -Trial Zyrtec 10 mg daiky -Trial Pepcid 20 mg BID -Reassess at next appt  Acute intractable tension-type headache Patient has headache that comes and goes since this week. No red flags for increased ICP on hx or exam. Unable to fully see fundi-consider reassessment at next visit if persisting as patient has risk factors for pseudotumor cerebri. Neuro exam benign. Unlikely temporal arteritis as no eye pain or temporal tenderness and patient not in demographic of this  condition. -headache precautions discussed -symptomatic tx  Type 2 diabetes mellitus with hyperglycemia (HCC) On ozempic 2 mg weekly. A1c 5.7 today well controlled.  -consider mounjaro for weight loss as patient interested -BMP  Essential hypertension, benign Not controlled. 167/103>152/105 on repeat. Patient has been adhering to medication regimen. Patient's insurance doesn't cover combined medication. -losartan 100 mg daily -HCTZ 25 mg daily -add amlodipine 5 mg daily (monitor for LE edema-discussed with pt) -BMP -BP follow up  Health Maintenance  Patient due for a repeat pap smear-she is following up with OBGYN on Monday. She has multiple care gaps>following up on multiple visits to accomplish these.  Gerrit Heck, MD [place], MD [place]

## 2021-12-29 ENCOUNTER — Other Ambulatory Visit: Payer: Self-pay | Admitting: Student

## 2021-12-29 ENCOUNTER — Ambulatory Visit (INDEPENDENT_AMBULATORY_CARE_PROVIDER_SITE_OTHER): Payer: BC Managed Care – PPO | Admitting: Student

## 2021-12-29 ENCOUNTER — Encounter: Payer: Self-pay | Admitting: Student

## 2021-12-29 VITALS — BP 152/105 | HR 96 | Ht 63.0 in | Wt 306.6 lb

## 2021-12-29 DIAGNOSIS — G44201 Tension-type headache, unspecified, intractable: Secondary | ICD-10-CM

## 2021-12-29 DIAGNOSIS — R051 Acute cough: Secondary | ICD-10-CM

## 2021-12-29 DIAGNOSIS — E1165 Type 2 diabetes mellitus with hyperglycemia: Secondary | ICD-10-CM | POA: Diagnosis not present

## 2021-12-29 DIAGNOSIS — I10 Essential (primary) hypertension: Secondary | ICD-10-CM

## 2021-12-29 LAB — POCT GLYCOSYLATED HEMOGLOBIN (HGB A1C): HbA1c, POC (controlled diabetic range): 5.7 % (ref 0.0–7.0)

## 2021-12-29 MED ORDER — FAMOTIDINE 20 MG PO TABS: 20.0000 mg | ORAL_TABLET | Freq: Two times a day (BID) | ORAL | 0 refills | Status: DC

## 2021-12-29 MED ORDER — AMLODIPINE BESYLATE 5 MG PO TABS: 5.0000 mg | ORAL_TABLET | Freq: Every day | ORAL | 0 refills | Status: DC

## 2021-12-29 MED ORDER — CETIRIZINE HCL 10 MG PO TABS: 10.0000 mg | ORAL_TABLET | Freq: Every day | ORAL | 0 refills | Status: AC

## 2021-12-29 NOTE — Assessment & Plan Note (Addendum)
Patient has been having a dry cough since Monday. No sick symptoms, less likely to be related to cold. Not on ACE inhibitor. No chest pain or hemoptysis and no tachycardia-unlikely to be PE. No wheezing on exam and no nocturnal cough less likely to be asthma. DDx likely allergy sxs vs GERD. SOB likely component of morbid obesity as well - reassuring lung exam and generally patient appeared well. -Trial Zyrtec 10 mg daiky -Trial Pepcid 20 mg BID -Reassess at next appt

## 2021-12-29 NOTE — Assessment & Plan Note (Signed)
Patient has headache that comes and goes since this week. No red flags for increased ICP on hx or exam. Unable to fully see fundi-consider reassessment at next visit if persisting as patient has risk factors for pseudotumor cerebri. Neuro exam benign. Unlikely temporal arteritis as no eye pain or temporal tenderness and patient not in demographic of this condition. -headache precautions discussed -symptomatic tx

## 2021-12-29 NOTE — Assessment & Plan Note (Signed)
On ozempic 2 mg weekly. A1c 5.7 today well controlled.  -consider mounjaro for weight loss as patient interested -BMP

## 2021-12-29 NOTE — Assessment & Plan Note (Signed)
Not controlled. 167/103>152/105 on repeat. Patient has been adhering to medication regimen. Patient's insurance doesn't cover combined medication. -losartan 100 mg daily -HCTZ 25 mg daily -add amlodipine 5 mg daily (monitor for LE edema-discussed with pt) -BMP -BP follow up

## 2021-12-29 NOTE — Patient Instructions (Addendum)
It was great to see you! Thank you for allowing me to participate in your care!   I recommend that you always bring your medications to each appointment as this makes it easy to ensure we are on the correct medications and helps [place] not miss when refills are needed.  Our plans for today:  - Please schedule your pap smear with OBGYN - Please follow up with me in 2 weeks for BP recheck I have added on amlodipine 5 mg daily, please check your BP at home - I am adding on pepcid and zyrtec for your cough, let me know if improving  We are checking some labs today, I will call you if they are abnormal will send you a MyChart message or a letter if they are normal.  If you do not hear about your labs in the next 2 weeks please let us know.  Take care and seek immediate care sooner if you develop any concerns. Please remember to show up 15 minutes before your scheduled appointment time!  Gerrit Heck, MD [place]

## 2021-12-30 LAB — BASIC METABOLIC PANEL
BUN/Creatinine Ratio: 16 (ref 9–23)
BUN: 12 mg/dL (ref 6–24)
CO2: 24 mmol/L (ref 20–29)
Calcium: 9.7 mg/dL (ref 8.7–10.2)
Chloride: 107 mmol/L — ABNORMAL HIGH (ref 96–106)
Creatinine, Ser: 0.73 mg/dL (ref 0.57–1.00)
Glucose: 104 mg/dL — ABNORMAL HIGH (ref 70–99)
Potassium: 4 mmol/L (ref 3.5–5.2)
Sodium: 144 mmol/L (ref 134–144)
eGFR: 101 mL/min/{1.73_m2} (ref >59)

## 2021-12-31 ENCOUNTER — Other Ambulatory Visit: Payer: Self-pay | Admitting: Family Medicine

## 2021-12-31 DIAGNOSIS — M79652 Pain in left thigh: Secondary | ICD-10-CM

## 2022-01-01 ENCOUNTER — Other Ambulatory Visit (HOSPITAL_COMMUNITY)
Admission: RE | Admit: 2022-01-01 | Discharge: 2022-01-01 | Disposition: A | Discharge status: Home / Self Care | Payer: BC Managed Care – PPO | Source: Ambulatory Visit | Attending: Obstetrics & Gynecology | Admitting: Obstetrics & Gynecology

## 2022-01-01 ENCOUNTER — Encounter: Payer: Self-pay | Admitting: Student

## 2022-01-01 ENCOUNTER — Telehealth: Payer: Self-pay

## 2022-01-01 ENCOUNTER — Encounter (HOSPITAL_BASED_OUTPATIENT_CLINIC_OR_DEPARTMENT_OTHER): Payer: Self-pay | Admitting: Obstetrics & Gynecology

## 2022-01-01 ENCOUNTER — Ambulatory Visit (HOSPITAL_BASED_OUTPATIENT_CLINIC_OR_DEPARTMENT_OTHER): Payer: BC Managed Care – PPO | Admitting: Obstetrics & Gynecology

## 2022-01-01 VITALS — BP 142/99 | HR 105 | Ht 63.0 in | Wt 305.4 lb

## 2022-01-01 DIAGNOSIS — R87619 Unspecified abnormal cytological findings in specimens from cervix uteri: Secondary | ICD-10-CM | POA: Diagnosis not present

## 2022-01-01 DIAGNOSIS — N951 Menopausal and female climacteric states: Secondary | ICD-10-CM

## 2022-01-01 NOTE — Progress Notes (Unsigned)
GYNECOLOGY  VISIT  CC:   Repeat pap smear  HPI: 51 y.o. [id number] Divorced Black or [place] American female here for repeat pap smear.  Had AGUS pap with neg HR HPV (and negative STI screening) 04/27/2021.  Was seen 06/29/2021.  Colposcopy showed no findings.  ECC and endometrial biopsy were done that day as well.  These were negative.  Follow up pap smear recommended.    Separately, cycles are changing.  Last bleeding was in April.  This was normal and lasted about 6 days.  It wasn't heavy.  This was fairly normal for her.  She is having hot flashes and night sweats as well.  Could consider treatment options but manageable for now.  Possible OTC options discussed as well.  Past Medical History:  Diagnosis Date   Diabetes type 2, controlled ([place])    History of gestational diabetes    Hypertension    Preeclampsia     MEDS:   Current Outpatient Medications on File Prior to Visit  Medication Sig Dispense Refill   amLODipine (NORVASC) 5 MG tablet Take 1 tablet (5 mg total) by mouth at bedtime. 90 tablet 0   atorvastatin (LIPITOR) 40 MG tablet TAKE 1 TABLET(40 MG) BY MOUTH DAILY 90 tablet 3   cetirizine (ZYRTEC ALLERGY) 10 MG tablet Take 1 tablet (10 mg total) by mouth daily. 30 tablet 0   famotidine (PEPCID) 20 MG tablet TAKE 1 TABLET(20 MG) BY MOUTH TWICE DAILY 180 tablet 0   hydrochlorothiazide (HYDRODIURIL) 25 MG tablet TAKE 1 TABLET(25 MG) BY MOUTH EVERY MORNING 90 tablet 3   losartan (COZAAR) 100 MG tablet Take 1 tablet (100 mg total) by mouth at bedtime. 90 tablet 3   Multiple Vitamins-Minerals (MULTIVITAMIN WITH MINERALS) tablet Take 1 tablet by mouth daily.     Semaglutide, 2 MG/DOSE, (OZEMPIC, 2 MG/DOSE,) 8 MG/3ML SOPN Inject 2 mg into the skin once a week. 3 mL 3   No current facility-administered medications on file prior to visit.    ALLERGIES: Lisinopril and Shrimp [shellfish allergy]  SH:  divorced, non smoker  Review of Systems  Constitutional: Negative.     PHYSICAL  EXAMINATION:    BP (!) 142/99 (BP Location: Right Arm, Patient Position: Sitting, Cuff Size: Large)   Pulse (!) 105   Ht '5\' 3"'$  (1.6 m) Comment: Reported  Wt (!) 305 lb 6.4 oz (138.5 kg)   BMI 54.10 kg/m     General appearance: alert, cooperative and appears stated age Lymph:  no inguinal LAD noted  Pelvic: External genitalia:  no lesions              Urethra:  normal appearing urethra with no masses, tenderness or lesions              Bartholins and Skenes: normal                 Vagina: normal appearing vagina with normal color and discharge, no lesions              Cervix: no lesions              Chaperone, Octaviano Batty, CMA, was present for exam.  Assessment/Plan: 1. Atypical glandular cells of undetermined significance (AGUS) on cervical Pap smear - pap and HR HPV obtained today.  If normal, will repeat with routine exam in about 6 months - Cytology - PAP( [place])   2.  Perimenopausal symptoms - pt does not desire any treatment at this time.

## 2022-01-04 LAB — CYTOLOGY - PAP
Comment: NEGATIVE
Diagnosis: NEGATIVE
High risk HPV: NEGATIVE

## 2022-01-09 ENCOUNTER — Other Ambulatory Visit: Payer: Self-pay | Admitting: Student

## 2022-01-09 DIAGNOSIS — Z1231 Encounter for screening mammogram for malignant neoplasm of breast: Secondary | ICD-10-CM

## 2022-01-11 NOTE — Progress Notes (Signed)
    SUBJECTIVE:   CHIEF COMPLAINT / HPI:   Hypertension: Patient is a 51 y.o. female who present today for follow up of hypertension.   Patient endorses swelling worse in legs due to amlodipine that is bothersome for her  Home medications include: Losartan 100 mg daily, HCTZ 25 mg daily, amlodipine 5 mg daily added at last visit Patient endorses taking these medications as prescribed. Denies any headache, vision changes, shortness of breath, lower extremity swelling or chest pain currently  Most recent creatinine trend:  Lab Results  Component Value Date   CREATININE 0.73 12/29/2021   CREATININE 0.63 05/05/2021   CREATININE 0.60 12/27/2020   Patient does check blood pressure at home. Highest was 154/84 and lowest 97/64   Last BMP with creatinine of 0.73, stable.  Fluttering in chest For weeks she feels like she has pauses in her heart beat and sometimes extra beats and a "fluttering sensation". No pain in chest. Not triggered by anything. Shortness of breath when having this sometimes. Better than it had been previously but still has it wondering about this.  She denies any pink-colored sputum or coughing.  PERTINENT  PMH / PSH: Morbid obesity, anxiety, T2DM  OBJECTIVE:   BP (!) 144/91   Pulse 96   Ht '5\' 3"'$  (1.6 m)   Wt (!) 308 lb 9.6 oz (140 kg)   SpO2 100%   BMI 54.67 kg/m   General: NAD, awake, alert, responsive to questions Head: Normocephalic atraumatic CV: Regular rate and rhythm no murmurs rubs or gallops Respiratory: Clear to ausculation bilaterally, no wheezes rales or crackles, no increased work of breathing Abdomen: Soft, non-tender, non-distended, normoactive bowel sounds  Extremities: Moves upper and lower extremities freely, 1+ nonpitting edema in LE  ASSESSMENT/PLAN:   Essential hypertension, benign Hypertension still not controlled at home and in office.  Patient unable to tolerate amlodipine. -Continue losartan 100 mg daily -Continue HCTZ 25 mg  daily -DC amlodipine and trial spironolactone 25 mg daily -Lab BMP recheck in 1 week to monitor potassium while on ARB and spironolactone  Palpitations Continues to have palpitations today.  I obtained an EKG in office today which overall showed normal sinus rhythm with some PVCs present.  This is most likely because of what she is feeling.  I discussed if she continues to have palpitations or feels like it is worsening that we can consider a Zio patch for evaluation.  Overall lower concern for arrhythmia.  In past was thought anxiety could be part of this as well. -Consider Zio patch if persisting -TSH, CBC, BMP    Gerrit Heck, MD [place]

## 2022-01-12 ENCOUNTER — Ambulatory Visit (INDEPENDENT_AMBULATORY_CARE_PROVIDER_SITE_OTHER): Payer: BC Managed Care – PPO | Admitting: Student

## 2022-01-12 ENCOUNTER — Ambulatory Visit (HOSPITAL_COMMUNITY): Payer: BC Managed Care – PPO | Attending: Family Medicine

## 2022-01-12 ENCOUNTER — Encounter: Payer: Self-pay | Admitting: Student

## 2022-01-12 VITALS — BP 144/91 | HR 96 | Ht 63.0 in | Wt 308.6 lb

## 2022-01-12 DIAGNOSIS — I10 Essential (primary) hypertension: Secondary | ICD-10-CM

## 2022-01-12 DIAGNOSIS — R002 Palpitations: Secondary | ICD-10-CM

## 2022-01-12 MED ORDER — SPIRONOLACTONE 25 MG PO TABS: 25.0000 mg | ORAL_TABLET | Freq: Every day | ORAL | 0 refills | Status: DC

## 2022-01-12 NOTE — Patient Instructions (Addendum)
It was great to see you! Thank you for allowing me to participate in your care!   I recommend that you always bring your medications to each appointment as this makes it easy to ensure we are on the correct medications and helps [place] not miss when refills are needed.  Our plans for today:  -We will check some labs today -EKG today  - if it still persisting palpitations on our next visit we will try and get a patch that stays on-year-old for 14 days to check your heart rhythms -I am adding spironolactone and discontinuing your amlodipine for your blood pressure.  Is important to get your labs rechecked in 1 week to make sure your potassium level is ok  We are checking some labs today, I will call you if they are abnormal will send you a MyChart message or a letter if they are normal.  If you do not hear about your labs in the next 2 weeks please let us know  Take care and seek immediate care sooner if you develop any concerns. Please remember to show up 15 minutes before your scheduled appointment time!  Gerrit Heck, MD [place]

## 2022-01-12 NOTE — Assessment & Plan Note (Signed)
Hypertension still not controlled at home and in office.  Patient unable to tolerate amlodipine. -Continue losartan 100 mg daily -Continue HCTZ 25 mg daily -DC amlodipine and trial spironolactone 25 mg daily -Lab BMP recheck in 1 week to monitor potassium while on ARB and spironolactone

## 2022-01-12 NOTE — Assessment & Plan Note (Signed)
Continues to have palpitations today.  I obtained an EKG in office today which overall showed normal sinus rhythm with some PVCs present.  This is most likely because of what she is feeling.  I discussed if she continues to have palpitations or feels like it is worsening that we can consider a Zio patch for evaluation.  Overall lower concern for arrhythmia.  In past was thought anxiety could be part of this as well. -Consider Zio patch if persisting -TSH, CBC, BMP

## 2022-01-13 LAB — BASIC METABOLIC PANEL
BUN/Creatinine Ratio: 15 (ref 9–23)
BUN: 10 mg/dL (ref 6–24)
CO2: 27 mmol/L (ref 20–29)
Calcium: 9.9 mg/dL (ref 8.7–10.2)
Chloride: 100 mmol/L (ref 96–106)
Creatinine, Ser: 0.67 mg/dL (ref 0.57–1.00)
Glucose: 85 mg/dL (ref 70–99)
Potassium: 3.6 mmol/L (ref 3.5–5.2)
Sodium: 142 mmol/L (ref 134–144)
eGFR: 108 mL/min/{1.73_m2} (ref >59)

## 2022-01-13 LAB — CBC
Hematocrit: 38.1 % (ref 34.0–46.6)
Hemoglobin: 12.1 g/dL (ref 11.1–15.9)
MCH: 26.4 pg — ABNORMAL LOW (ref 26.6–33.0)
MCHC: 31.8 g/dL (ref 31.5–35.7)
MCV: 83 fL (ref 79–97)
Platelets: 293 10*3/uL (ref 150–450)
RBC: 4.58 x10E6/uL (ref 3.77–5.28)
RDW: 13.1 % (ref 11.7–15.4)
WBC: 5.6 10*3/uL (ref 3.4–10.8)

## 2022-01-13 LAB — TSH: TSH: 0.875 u[IU]/mL (ref 0.450–4.500)

## 2022-01-22 ENCOUNTER — Telehealth: Payer: Self-pay

## 2022-02-02 ENCOUNTER — Encounter: Payer: Self-pay | Admitting: Student

## 2022-02-02 ENCOUNTER — Other Ambulatory Visit: Payer: Self-pay | Admitting: Student

## 2022-02-02 ENCOUNTER — Ambulatory Visit: Payer: BC Managed Care – PPO | Admitting: Student

## 2022-02-02 VITALS — BP 130/83 | HR 95 | Ht 63.0 in | Wt 303.4 lb

## 2022-02-02 DIAGNOSIS — K5900 Constipation, unspecified: Secondary | ICD-10-CM | POA: Diagnosis not present

## 2022-02-02 DIAGNOSIS — L02224 Furuncle of groin: Secondary | ICD-10-CM

## 2022-02-02 DIAGNOSIS — I10 Essential (primary) hypertension: Secondary | ICD-10-CM

## 2022-02-02 MED ORDER — POLYETHYLENE GLYCOL 3350 17 GM/SCOOP PO POWD: 17.0000 g | Freq: Two times a day (BID) | ORAL | 1 refills | Status: AC | PRN

## 2022-02-02 MED ORDER — MUPIROCIN 2 % EX OINT: 1.0000 | TOPICAL_OINTMENT | Freq: Two times a day (BID) | CUTANEOUS | 0 refills | Status: AC

## 2022-02-02 NOTE — Progress Notes (Signed)
    SUBJECTIVE:   CHIEF COMPLAINT / HPI:   Hypertension: Patient is a 51 y.o. female who present today for follow up of hypertension.   Patient endorses no problems  Home medications include: losartan 100 mg daily, HCTZ 25 mg daily, spironolactone 25 mg daily Patient endorses taking these medications as prescribed.  Denies any headache, vision changes, shortness of breath, lower extremity swelling or chest pain   Most recent creatinine trend:  Lab Results  Component Value Date   CREATININE 0.67 01/12/2022   CREATININE 0.73 12/29/2021   CREATININE 0.63 05/05/2021   Constipation Has been having new onset constipation for the past few weeks.  She says that there is also a small  amount of bright red blood when wiping.  She has not noticed blood mixed into her stools.  She denies any rectal pain.  She says the blood only happens when she is straining really hard.  She has never gotten a colonoscopy before.  PERTINENT  PMH / PSH: T2DM  OBJECTIVE:   BP 130/83   Pulse 95   Ht '5\' 3"'$  (1.6 m)   Wt (!) 303 lb 6.4 oz (137.6 kg)   SpO2 98%   BMI 53.74 kg/m   General: Well appearing, NAD, awake, alert, responsive to questions Head: Normocephalic atraumatic CV: Regular rate and rhythm no murmurs rubs or gallops Respiratory: Clear to ausculation bilaterally, no wheezes rales or crackles, chest rises symmetrically,  no increased work of breathing Abdomen: Soft, non-tender, non-distended, normoactive bowel sounds   ASSESSMENT/PLAN:   Essential hypertension, benign Blood pressure controlled today 130/83. -BMP today to check for hyperkalemia -Continue spironolactone 25 mg daily -Continue HCTZ 25 mg daily -Continue losartan 100 mg daily  Constipation New onset constipation for the past few weeks with small amount of bright red blood when wiping.  Patient deferred rectal examination today.  We discussed if worsening can come in for rectal examination. -Ambulatory referral to GI for  diagnostic colonoscopy -MiraLAX 17 g twice daily -Monitor at future visits    Gerrit Heck, MD [place]

## 2022-02-02 NOTE — Assessment & Plan Note (Signed)
New onset constipation for the past few weeks with small amount of bright red blood when wiping.  Patient deferred rectal examination today.  We discussed if worsening can come in for rectal examination. -Ambulatory referral to GI for diagnostic colonoscopy -MiraLAX 17 g twice daily -Monitor at future visits

## 2022-02-02 NOTE — Patient Instructions (Addendum)
It was great to see you! Thank you for allowing me to participate in your care!   I recommend that you always bring your medications to each appointment as this makes it easy to ensure we are on the correct medications and helps [place] not miss when refills are needed.  Our plans for today:  - We will check your labs today, your BP looks great! Continue same meds for now - I have sent a referral for GI for a colonoscopy - We will try miralax daily to help with constipation  We are checking some labs today, I will call you if they are abnormal will send you a MyChart message or a letter if they are normal.  If you do not hear about your labs in the next 2 weeks please let us know.  Take care and seek immediate care sooner if you develop any concerns. Please remember to show up 15 minutes before your scheduled appointment time!  Gerrit Heck, MD [place]

## 2022-02-02 NOTE — Assessment & Plan Note (Signed)
Blood pressure controlled today 130/83. -BMP today to check for hyperkalemia -Continue spironolactone 25 mg daily -Continue HCTZ 25 mg daily -Continue losartan 100 mg daily

## 2022-02-03 LAB — BASIC METABOLIC PANEL
BUN/Creatinine Ratio: 16 (ref 9–23)
BUN: 12 mg/dL (ref 6–24)
CO2: 25 mmol/L (ref 20–29)
Calcium: 9.6 mg/dL (ref 8.7–10.2)
Chloride: 100 mmol/L (ref 96–106)
Creatinine, Ser: 0.76 mg/dL (ref 0.57–1.00)
Glucose: 89 mg/dL (ref 70–99)
Potassium: 4 mmol/L (ref 3.5–5.2)
Sodium: 139 mmol/L (ref 134–144)
eGFR: 96 mL/min/{1.73_m2} (ref >59)

## 2022-02-07 ENCOUNTER — Other Ambulatory Visit: Payer: Self-pay | Admitting: Family Medicine

## 2022-02-07 ENCOUNTER — Encounter: Payer: Self-pay | Admitting: Student

## 2022-02-07 DIAGNOSIS — E1165 Type 2 diabetes mellitus with hyperglycemia: Secondary | ICD-10-CM

## 2022-02-15 ENCOUNTER — Encounter: Payer: Self-pay | Admitting: Student

## 2022-02-26 ENCOUNTER — Encounter: Payer: Self-pay | Admitting: Student

## 2022-02-26 DIAGNOSIS — E1165 Type 2 diabetes mellitus with hyperglycemia: Secondary | ICD-10-CM

## 2022-02-27 MED ORDER — OZEMPIC (2 MG/DOSE) 8 MG/3ML ~~LOC~~ SOPN: PEN_INJECTOR | SUBCUTANEOUS | 3 refills | Status: DC

## 2022-03-01 ENCOUNTER — Other Ambulatory Visit (HOSPITAL_COMMUNITY): Payer: Self-pay

## 2022-03-01 ENCOUNTER — Telehealth: Payer: Self-pay | Admitting: Pharmacist

## 2022-03-01 ENCOUNTER — Other Ambulatory Visit: Payer: Self-pay | Admitting: Family Medicine

## 2022-03-01 DIAGNOSIS — E1165 Type 2 diabetes mellitus with hyperglycemia: Secondary | ICD-10-CM

## 2022-03-01 DIAGNOSIS — L732 Hidradenitis suppurativa: Secondary | ICD-10-CM

## 2022-03-01 MED ORDER — TRULICITY 4.5 MG/0.5ML ~~LOC~~ SOAJ: 4.5000 mg | SUBCUTANEOUS | 6 refills | Status: DC

## 2022-03-01 NOTE — Assessment & Plan Note (Signed)
Phone call to patient to discuss option for Ozempic (semaglutide) shortage.  Phone conversation conducted by Geraldo Docker, PharmD Candidate.   Patient educated on purpose/interchange, proper use and potential adverse effects of Trulicity (dulaglutide).  Following instruction patient verbalized understanding of treatment plan. Patient denied any nausea or GI effects with use of Ozempic in the past.   New prescription provided.   Trulcity 4.'5mg'$  once weekly which is covered at the same tier as Ozempic on her formulary.  Darcel Bayley option was previously discussed with PCP, is not covered currently on insurance formulary).

## 2022-03-01 NOTE — Telephone Encounter (Signed)
Phone call to patient to discuss option for Ozempic (semaglutide) shortage.  Phone conversation conducted by Geraldo Docker, PharmD Candidate.   Patient educated on purpose/interchange, proper use and potential adverse effects of Trulicity (dulaglutide).  Following instruction patient verbalized understanding of treatment plan. Patient denied any nausea or GI effects with use of Ozempic in the past.   New prescription provided.   Trulcity 4.'5mg'$  once weekly which is covered at the same tier as Ozempic on her formulary.  Darcel Bayley option was previously discussed with PCP, is not covered currently on insurance formulary).

## 2022-03-13 ENCOUNTER — Telehealth: Payer: Self-pay

## 2022-03-13 ENCOUNTER — Ambulatory Visit (INDEPENDENT_AMBULATORY_CARE_PROVIDER_SITE_OTHER): Payer: BC Managed Care – PPO | Admitting: Gastroenterology

## 2022-03-13 ENCOUNTER — Encounter: Payer: Self-pay | Admitting: Gastroenterology

## 2022-03-13 VITALS — BP 132/82 | HR 106 | Ht 63.0 in | Wt 307.0 lb

## 2022-03-13 DIAGNOSIS — Z83719 Family history of colon polyps, unspecified: Secondary | ICD-10-CM | POA: Diagnosis not present

## 2022-03-13 DIAGNOSIS — Z1211 Encounter for screening for malignant neoplasm of colon: Secondary | ICD-10-CM | POA: Diagnosis not present

## 2022-03-13 NOTE — Telephone Encounter (Signed)
Pt added to wait list.

## 2022-03-13 NOTE — Patient Instructions (Signed)
_______________________________________________________  If you are age 49 or older, your body mass index should be between 23-30. Your Body mass index is 54.38 kg/m. If this is out of the aforementioned range listed, please consider follow up with your Primary Care Provider.  If you are age 20 or younger, your body mass index should be between 19-25. Your Body mass index is 54.38 kg/m. If this is out of the aformentioned range listed, please consider follow up with your Primary Care Provider.   ________________________________________________________  The [facility] providers would like to encourage you to use [facility] to communicate with providers for non-urgent requests or questions.  Due to long hold times on the telephone, sending your provider a message by [facility] may be a faster and more efficient way to get a response.  Please allow 48 business hours for a response.  Please remember that this is for non-urgent requests.  _______________________________________________________  We will call you to schedule a colonoscopy at the hospital.

## 2022-03-13 NOTE — Telephone Encounter (Signed)
-----   Message from Audrea Muscat, [place] sent at 03/13/2022  2:07 PM EST ----- Patient needs a colonoscopy at [facility] told her we would call her when we found a spot after the first of the year.

## 2022-03-13 NOTE — Progress Notes (Signed)
Chief Complaint: For colonoscopy  Referring Provider:  Zenia Resides, MD      ASSESSMENT AND PLAN;   #1. Colorectal cancer screening  #2. FH colon polyps  Plan: -Colon June/july 2022 at [facility]  (d/t BMI>50). -I have also encouraged her to start exercising and reduce weight.   Discussed risks & benefits. Risks including rare perforation req laparotomy, bleeding after bx/polypectomy req blood transfusion, rarely missing neoplasms, risks of anesthesia/sedation. Benefits outweigh the risks. Patient agrees to proceed. All the questions were answered. Consent forms given for review.    HPI:    Stacey Robbins is a 51 y.o. female   Constipation is better with high-fiber diet and increased water intake.  Here to set up for colonoscopy.  No nausea, vomiting, heartburn (better with as needed Tums), regurgitation, odynophagia or dysphagia.  No significant diarrhea or constipation. No melena or hematochezia. No unintentional weight loss. No abdominal pain.  FH polyps- mom at 55  SH-daughter who works with school system. Would be off in June/July. Patient works with [employer] Past Medical History:  Diagnosis Date   Diabetes type 2, controlled ([place])    History of gestational diabetes    Hypertension    Preeclampsia     Past Surgical History:  Procedure Laterality Date   CESAREAN SECTION     2004   TUBAL LIGATION     2004    Family History  Problem Relation Age of Onset   Hypertension Mother    Diabetes Father    Hypertension Father    Breast cancer Maternal Aunt        65s   Cancer Maternal Aunt        breast cancer    Brain cancer Maternal Uncle    Prostate cancer Paternal Grandfather     Social History   Tobacco Use   Smoking status: Never   Smokeless tobacco: Never  Vaping Use   Vaping Use: Never used  Substance Use Topics   Alcohol use: No   Drug use: No    Current Outpatient Medications  Medication Sig Dispense Refill   atorvastatin (LIPITOR) 40 MG  tablet TAKE 1 TABLET(40 MG) BY MOUTH DAILY 90 tablet 3   cetirizine (ZYRTEC ALLERGY) 10 MG tablet Take 1 tablet (10 mg total) by mouth daily. 30 tablet 0   Dulaglutide (TRULICITY) 4.5 QM/5.7QI SOPN Inject 4.5 mg as directed once a week. Replaces prescription for Ozempic 2 mL 6   famotidine (PEPCID) 20 MG tablet TAKE 1 TABLET(20 MG) BY MOUTH TWICE DAILY 180 tablet 0   hydrochlorothiazide (HYDRODIURIL) 25 MG tablet TAKE 1 TABLET(25 MG) BY MOUTH EVERY MORNING 90 tablet 3   losartan (COZAAR) 100 MG tablet Take 1 tablet (100 mg total) by mouth at bedtime. 90 tablet 3   Multiple Vitamins-Minerals (MULTIVITAMIN WITH MINERALS) tablet Take 1 tablet by mouth daily.     polyethylene glycol powder (GLYCOLAX/MIRALAX) 17 GM/SCOOP powder Take 17 g by mouth 2 (two) times daily as needed. 3350 g 1   spironolactone (ALDACTONE) 25 MG tablet Take 1 tablet (25 mg total) by mouth at bedtime. 90 tablet 0   No current facility-administered medications for this visit.    Allergies  Allergen Reactions   Lisinopril     cough   Shrimp [Shellfish Allergy]     And crab meat. Reports itchy throat, nausea, and vomiting.     Review of Systems:  neg     Physical Exam:    BP 132/82  Pulse (!) 106   Ht '5\' 3"'$  (1.6 m)   Wt (!) 307 lb (139.3 kg)   BMI 54.38 kg/m  Wt Readings from Last 3 Encounters:  03/13/22 (!) 307 lb (139.3 kg)  02/02/22 (!) 303 lb 6.4 oz (137.6 kg)  01/12/22 (!) 308 lb 9.6 oz (140 kg)   Constitutional:  Well-developed, in no acute distress. Psychiatric: Normal mood and affect. Behavior is normal. HEENT: Pupils normal.  Conjunctivae are normal. No scleral icterus. Cardiovascular: Normal rate, regular rhythm. No edema Pulmonary/chest: Effort normal and breath sounds normal. No wheezing, rales or rhonchi. Abdominal: Soft, nondistended. Nontender. Bowel sounds active throughout. There are no masses palpable. No hepatomegaly. Rectal: Deferred Neurological: Alert and oriented to person place  and time. Skin: Skin is warm and dry. No rashes noted.  Data Reviewed: I have personally reviewed following labs and imaging studies  CBC:    Latest Ref Rng & Units 01/12/2022    3:25 PM 07/20/2016    8:45 AM 06/15/2013   11:36 AM  CBC  WBC 3.4 - 10.8 x10E3/uL 5.6  5.9  6.8   Hemoglobin 11.1 - 15.9 g/dL 12.1  12.1  12.5   Hematocrit 34.0 - 46.6 % 38.1  36.9  38.2   Platelets 150 - 450 x10E3/uL 293  343  348     CMP:    Latest Ref Rng & Units 02/02/2022   11:05 AM 01/12/2022    3:25 PM 12/29/2021    9:20 AM  CMP  Glucose 70 - 99 mg/dL 89  85  104   BUN 6 - 24 mg/dL '12  10  12   '$ Creatinine 0.57 - 1.00 mg/dL 0.76  0.67  0.73   Sodium 134 - 144 mmol/L 139  142  144   Potassium 3.5 - 5.2 mmol/L 4.0  3.6  4.0   Chloride 96 - 106 mmol/L 100  100  107   CO2 20 - 29 mmol/L '25  27  24   '$ Calcium 8.7 - 10.2 mg/dL 9.6  9.9  9.7         Radiology Studies: No results found.    Carmell [place], MD 03/13/2022, 1:59 PM  Cc: Zenia Resides, MD

## 2022-03-19 ENCOUNTER — Other Ambulatory Visit: Payer: Self-pay | Admitting: Student

## 2022-03-19 DIAGNOSIS — Z1231 Encounter for screening mammogram for malignant neoplasm of breast: Secondary | ICD-10-CM

## 2022-03-26 ENCOUNTER — Other Ambulatory Visit: Payer: Self-pay

## 2022-03-26 DIAGNOSIS — Z1211 Encounter for screening for malignant neoplasm of colon: Secondary | ICD-10-CM

## 2022-03-26 DIAGNOSIS — Z83719 Family history of colon polyps, unspecified: Secondary | ICD-10-CM

## 2022-04-05 ENCOUNTER — Ambulatory Visit
Admission: RE | Admit: 2022-04-05 | Discharge: 2022-04-05 | Disposition: A | Discharge status: Home / Self Care | Payer: BC Managed Care – PPO | Source: Ambulatory Visit

## 2022-04-05 DIAGNOSIS — Z1231 Encounter for screening mammogram for malignant neoplasm of breast: Secondary | ICD-10-CM

## 2022-04-08 ENCOUNTER — Other Ambulatory Visit: Payer: Self-pay | Admitting: Student

## 2022-04-08 DIAGNOSIS — I10 Essential (primary) hypertension: Secondary | ICD-10-CM

## 2022-04-19 ENCOUNTER — Telehealth: Payer: Self-pay

## 2022-04-19 NOTE — Telephone Encounter (Signed)
Left message for pt to call back to discuss details of the Colonoscopy recently scheduled on 06/28/2022

## 2022-04-19 NOTE — Telephone Encounter (Signed)
Case ID [id number]

## 2022-05-03 ENCOUNTER — Other Ambulatory Visit: Payer: Self-pay

## 2022-05-03 DIAGNOSIS — Z1211 Encounter for screening for malignant neoplasm of colon: Secondary | ICD-10-CM

## 2022-05-03 DIAGNOSIS — Z83719 Family history of colon polyps, unspecified: Secondary | ICD-10-CM

## 2022-05-03 NOTE — Telephone Encounter (Signed)
Pt was contacted to discuss the prep of her procedure: Colonoscopy that is scheduled for 06/28/2022 at [facility] at 8:30 with Dr. Lyndel Safe. Prep instructions sent to pt via mychart:  Pt is on Trulicity: Pt notified do not take three days prior to the procedure: Prep instructions sent to pt via my chart:Pt made aware. Ambulatory ref to GI Placed in [place]. Pt verbalized understanding with all questions answered.

## 2022-05-03 NOTE — Telephone Encounter (Signed)
Incoming call from patient states she is returning a call from a week ago.

## 2022-05-18 ENCOUNTER — Telehealth: Payer: Self-pay

## 2022-05-18 NOTE — Telephone Encounter (Signed)
This message is to inform you that the patient has not yet read the following message. (Notification date: May 17, 3886)    Hold Trulicity  From Gillermina Hu, RN To [place], [place] and Delivered 05/03/2022  4:25 PM      Hi Ms Komar:     I an addition to the prep letter sent to you for your colonoscopy instructions please ensure that you DO NOT take your Trulicity three days prior to the procedure:  Thanks  Remo Lipps RN     [address]  MyChart User Last Read On  [place] Not Read

## 2022-05-18 NOTE — Telephone Encounter (Signed)
Pt was made aware of message that was sent that has not been read.   Pt stated that she was aware and that I notified her of that over the phone previously: Pt verbalized understanding with all questions answered.

## 2022-05-25 NOTE — Telephone Encounter (Signed)
Pharamacy sent over a fax requesting MUPIROCIN 2% OINTMENT for the pt.

## 2022-05-25 NOTE — Telephone Encounter (Signed)
Pharmacy sent a fax requesting MUPIROCIN 2% OINTMENT 22GM  . I currently don't see this medication on pt med list, if you would like the pt to have this then please contact the pharmacy.

## 2022-05-25 NOTE — Telephone Encounter (Deleted)
Pharmacy sent a fax requesting MUPIROCIN 2% OINTMENT 22GM  . I currently don't see this medication on pt med list, if you would like the pt to have this then please contact the pharmacy.

## 2022-06-21 ENCOUNTER — Encounter: Payer: Self-pay | Admitting: Gastroenterology

## 2022-06-28 ENCOUNTER — Ambulatory Visit (HOSPITAL_COMMUNITY)
Admission: RE | Admit: 2022-06-28 | Discharge: 2022-06-28 | Disposition: A | Discharge status: Home / Self Care | Payer: BC Managed Care – PPO | Attending: Gastroenterology | Admitting: Gastroenterology

## 2022-06-28 ENCOUNTER — Encounter (HOSPITAL_COMMUNITY)
Admission: RE | Disposition: A | Discharge status: Home / Self Care | Payer: Self-pay | Source: Home / Self Care | Attending: Gastroenterology

## 2022-06-28 ENCOUNTER — Ambulatory Visit (HOSPITAL_COMMUNITY): Payer: BC Managed Care – PPO | Admitting: Certified Registered Nurse Anesthetist

## 2022-06-28 ENCOUNTER — Other Ambulatory Visit: Payer: Self-pay

## 2022-06-28 ENCOUNTER — Encounter (HOSPITAL_COMMUNITY): Payer: Self-pay | Admitting: Gastroenterology

## 2022-06-28 DIAGNOSIS — K640 First degree hemorrhoids: Secondary | ICD-10-CM | POA: Diagnosis not present

## 2022-06-28 DIAGNOSIS — I10 Essential (primary) hypertension: Secondary | ICD-10-CM | POA: Diagnosis not present

## 2022-06-28 DIAGNOSIS — K5900 Constipation, unspecified: Secondary | ICD-10-CM | POA: Insufficient documentation

## 2022-06-28 DIAGNOSIS — D122 Benign neoplasm of ascending colon: Secondary | ICD-10-CM | POA: Diagnosis not present

## 2022-06-28 DIAGNOSIS — K5730 Diverticulosis of large intestine without perforation or abscess without bleeding: Secondary | ICD-10-CM | POA: Diagnosis not present

## 2022-06-28 DIAGNOSIS — D124 Benign neoplasm of descending colon: Secondary | ICD-10-CM | POA: Insufficient documentation

## 2022-06-28 DIAGNOSIS — D125 Benign neoplasm of sigmoid colon: Secondary | ICD-10-CM | POA: Insufficient documentation

## 2022-06-28 DIAGNOSIS — E119 Type 2 diabetes mellitus without complications: Secondary | ICD-10-CM | POA: Insufficient documentation

## 2022-06-28 DIAGNOSIS — Z1211 Encounter for screening for malignant neoplasm of colon: Secondary | ICD-10-CM

## 2022-06-28 DIAGNOSIS — Z83719 Family history of colon polyps, unspecified: Secondary | ICD-10-CM

## 2022-06-28 DIAGNOSIS — K219 Gastro-esophageal reflux disease without esophagitis: Secondary | ICD-10-CM | POA: Insufficient documentation

## 2022-06-28 DIAGNOSIS — D123 Benign neoplasm of transverse colon: Secondary | ICD-10-CM | POA: Diagnosis not present

## 2022-06-28 DIAGNOSIS — D120 Benign neoplasm of cecum: Secondary | ICD-10-CM | POA: Diagnosis not present

## 2022-06-28 HISTORY — PX: COLONOSCOPY WITH PROPOFOL: SHX5780

## 2022-06-28 HISTORY — PX: SUBMUCOSAL TATTOO INJECTION: SHX6856

## 2022-06-28 HISTORY — PX: POLYPECTOMY: SHX5525

## 2022-06-28 LAB — GLUCOSE, CAPILLARY
Glucose-Capillary: 122 mg/dL — ABNORMAL HIGH (ref 70–99)
Glucose-Capillary: 113 mg/dL — ABNORMAL HIGH (ref 70–99)

## 2022-06-28 SURGERY — COLONOSCOPY WITH PROPOFOL
Anesthesia: Monitor Anesthesia Care

## 2022-06-28 MED ORDER — LACTATED RINGERS IV SOLN: INTRAVENOUS | Status: DC | PRN

## 2022-06-28 MED ORDER — SPOT INK MARKER SYRINGE KIT
PACK | SUBMUCOSAL | Status: AC
  Filled 2022-06-28: qty 5

## 2022-06-28 MED ORDER — SPOT INK MARKER SYRINGE KIT
PACK | SUBMUCOSAL | Status: DC | PRN
  Administered 2022-06-28: 1 mL via SUBMUCOSAL

## 2022-06-28 MED ORDER — PROPOFOL 500 MG/50ML IV EMUL
INTRAVENOUS | Status: DC | PRN
  Administered 2022-06-28: 150 ug/kg/min via INTRAVENOUS

## 2022-06-28 MED ORDER — PROPOFOL 10 MG/ML IV BOLUS
INTRAVENOUS | Status: DC | PRN
  Administered 2022-06-28: 20 mg via INTRAVENOUS
  Administered 2022-06-28: 80 mg via INTRAVENOUS

## 2022-06-28 MED ORDER — SODIUM CHLORIDE 0.9 % IV SOLN: INTRAVENOUS | Status: DC

## 2022-06-28 MED ORDER — LIDOCAINE 2% (20 MG/ML) 5 ML SYRINGE
INTRAMUSCULAR | Status: DC | PRN
  Administered 2022-06-28: 80 mg via INTRAVENOUS

## 2022-06-28 SURGICAL SUPPLY — 22 items

## 2022-06-28 NOTE — Anesthesia Preprocedure Evaluation (Signed)
Anesthesia Evaluation    Reviewed: Allergy & Precautions, Patient's Chart, lab work & pertinent test results  Airway Mallampati: II  TM Distance: >3 FB Neck ROM: Full    Dental  (+) Teeth Intact, Dental Advisory Given   Pulmonary neg pulmonary ROS   breath sounds clear to auscultation       Cardiovascular hypertension, Pt. on medications  Rhythm:Regular Rate:Normal     Neuro/Psych  Headaches  Anxiety        GI/Hepatic Neg liver ROS,GERD  Medicated and Controlled,,  Endo/Other  diabetes    Renal/GU      Musculoskeletal negative musculoskeletal ROS (+)    Abdominal   Peds  Hematology negative hematology ROS (+)   Anesthesia Other Findings   Reproductive/Obstetrics                             Anesthesia Physical Anesthesia Plan  ASA: 3  Anesthesia Plan: MAC   Post-op Pain Management: Minimal or no pain anticipated   Induction: Intravenous  PONV Risk Score and Plan: 0 and Propofol infusion  Airway Management Planned: Natural Airway and Simple Face Mask  Additional Equipment: None  Intra-op Plan:   Post-operative Plan:   Informed Consent: I have reviewed the patients History and Physical, chart, labs and discussed the procedure including the risks, benefits and alternatives for the proposed anesthesia with the patient or authorized representative who has indicated his/her understanding and acceptance.       Plan Discussed with: CRNA  Anesthesia Plan Comments:        Anesthesia Quick Evaluation

## 2022-06-28 NOTE — Transfer of Care (Signed)
Immediate Anesthesia Transfer of Care Note  Patient: Stacey Robbins  Procedure(s) Performed: COLONOSCOPY WITH PROPOFOL POLYPECTOMY  Patient Location: PACU  Anesthesia Type:MAC  Level of Consciousness: awake, alert , and oriented  Airway & Oxygen Therapy: Patient Spontanous Breathing  Post-op Assessment: Report given to RN and Post -op Vital signs reviewed and stable  Post vital signs: Reviewed and stable  Last Vitals:  Vitals Value Taken Time  BP 139/91 06/28/22 0915  Temp 36.6 C 06/28/22 0909  Pulse 87 06/28/22 0921  Resp 22 06/28/22 0921  SpO2 99 % 06/28/22 0921  Vitals shown include unvalidated device data.  Last Pain:  Vitals:   06/28/22 0909  TempSrc:   PainSc: 0-No pain         Complications: No notable events documented.

## 2022-06-28 NOTE — Op Note (Signed)
[facility] Patient Name: Stacey Robbins Procedure Date : 06/28/2022 MRN: [id number] Attending MD: Jackquline Denmark , MD, [id number] Date of Birth: Apr 11, 1971 CSN: [id number] Age: 52 Admit Type: Inpatient Procedure:                Colonoscopy Indications:              Screening for colorectal malignant neoplasm. FH                            colon polyps (mom at age 68) Providers:                Jackquline Denmark, MD, Dulcy Fanny, Benetta Spar, Technician Referring MD:              Medicines:                Monitored Anesthesia Care Complications:            No immediate complications. Estimated Blood Loss:     Estimated blood loss: none. Procedure:                Pre-Anesthesia Assessment:                           - Prior to the procedure, a History and Physical                            was performed, and patient medications and                            allergies were reviewed. The patient's tolerance of                            previous anesthesia was also reviewed. The risks                            and benefits of the procedure and the sedation                            options and risks were discussed with the patient.                            All questions were answered, and informed consent                            was obtained. Prior Anticoagulants: The patient has                            taken no anticoagulant or antiplatelet agents. ASA                            Grade Assessment: II - A patient with mild systemic  disease. After reviewing the risks and benefits,                            the patient was deemed in satisfactory condition to                            undergo the procedure.                           After obtaining informed consent, the colonoscope                            was passed under direct vision. Throughout the                            procedure, the patient's blood  pressure, pulse, and                            oxygen saturations were monitored continuously. The                            [id number] Olympus colonoscope was                            introduced through the anus and advanced to the the                            cecum, identified by appendiceal orifice and                            ileocecal valve. The colonoscopy was performed                            without difficulty. The patient tolerated the                            procedure well. The quality of the bowel                            preparation was good. The ileocecal valve,                            appendiceal orifice, and rectum were photographed. Scope In: 8:35:14 AM Scope Out: 9:02:37 AM Scope Withdrawal Time: 0 hours 21 minutes 47 seconds  Total Procedure Duration: 0 hours 27 minutes 23 seconds  Findings:      Three sessile polyps were found in the proximal ascending colon, distal       ascending colon and cecum. The polyps were 4 to 10 mm in size. The       smaller polyps were removed with cold snare and the larger polyp (in the       distal ascending colon) removed with a hot snare. Resection and       retrieval were complete.      A 6 mm polyp was found in the mid transverse colon. The polyp  was       sessile. The polyp was removed with a cold snare. Resection and       retrieval were complete.      A 10 mm polyp was found in the mid descending colon. The polyp was       semi-pedunculated. The polyp was removed with a hot snare. Resection and       retrieval were complete.      A 20 mm polyp was found in the mid sigmoid colon. The polyp was       pedunculated. The polyp was removed with a hot snare. Resection and       retrieval were complete. Area was tattooed with an injection of 1 mL of       Spot (carbon black).      Multiple medium-mouthed diverticula were found in the sigmoid colon.      Non-bleeding internal hemorrhoids were found during  retroflexion. The       hemorrhoids were moderate and Grade I (internal hemorrhoids that do not       prolapse).      The exam was otherwise without abnormality on direct and retroflexion       views. Impression:               - Three 4 to 10 mm polyps in the proximal ascending                            colon, in the distal ascending colon and in the                            cecum, removed with a hot snare. Resected and                            retrieved.                           - One 6 mm polyp in the mid transverse colon,                            removed with a cold snare. Resected and retrieved.                           - One 10 mm polyp in the mid descending colon,                            removed with a hot snare. Resected and retrieved.                           - One 20 mm polyp in the mid sigmoid colon, removed                            with a hot snare. Resected and retrieved. Tattooed.                           - Moderate sigmoid diverticulosis.                           -  Non-bleeding internal hemorrhoids.                           - The examination was otherwise normal on direct                            and retroflexion views. Recommendation:           - Patient has a contact number available for                            emergencies. The signs and symptoms of potential                            delayed complications were discussed with the                            patient. Return to normal activities tomorrow.                            Written discharge instructions were provided to the                            patient.                           - Resume previous diet.                           - No aspirin, ibuprofen, naproxen, or other                            non-steroidal anti-inflammatory drugs for 5 days                            after polyp removal.                           - Await pathology results.                           - Repeat  colonoscopy for surveillance based on                            pathology results.                           - The findings and recommendations were discussed                            with the patient's daughter. Procedure Code(s):        --- Professional ---                           [phone], Colonoscopy, flexible; with removal of  tumor(s), polyp(s), or other lesion(s) by snare                            technique                           45381, Colonoscopy, flexible; with directed                            submucosal injection(s), any substance Diagnosis Code(s):        --- Professional ---                           Z12.11, Encounter for screening for malignant                            neoplasm of colon                           D12.2, Benign neoplasm of ascending colon                           D12.0, Benign neoplasm of cecum                           D12.3, Benign neoplasm of transverse colon (hepatic                            flexure or splenic flexure)                           D12.4, Benign neoplasm of descending colon                           D12.5, Benign neoplasm of sigmoid colon                           K64.0, First degree hemorrhoids                           K57.30, Diverticulosis of large intestine without                            perforation or abscess without bleeding CPT copyright 2022 American Medical Association. All rights reserved. The codes documented in this report are preliminary and upon coder review may  be revised to meet current compliance requirements. Jackquline Denmark, MD 06/28/2022 9:17:12 AM This report has been signed electronically. Number of Addenda: 0

## 2022-06-28 NOTE — Anesthesia Postprocedure Evaluation (Signed)
Anesthesia Post Note  Patient: CECILLE FEHLMAN  Procedure(s) Performed: COLONOSCOPY WITH PROPOFOL POLYPECTOMY     Patient location during evaluation: PACU Anesthesia Type: MAC Level of consciousness: awake and alert Pain management: pain level controlled Vital Signs Assessment: post-procedure vital signs reviewed and stable Respiratory status: spontaneous breathing, nonlabored ventilation, respiratory function stable and patient connected to nasal cannula oxygen Cardiovascular status: stable and blood pressure returned to baseline Postop Assessment: no apparent nausea or vomiting Anesthetic complications: no  No notable events documented.  Last Vitals:  Vitals:   06/28/22 0930 06/28/22 0945  BP: (!) 147/95 (!) 144/89  Pulse: 82 78  Resp: 14 13  Temp: 36.7 C 36.7 C  SpO2: 98% 97%    Last Pain:  Vitals:   06/28/22 0930  TempSrc:   PainSc: 0-No pain                 Effie Berkshire

## 2022-06-28 NOTE — H&P (Signed)
[facility] History and Physical   Primary Care Physician:  Gerrit Heck, MD   Reason for Procedure:   CRC screening/FH polyps  Plan:    Colon today     HPI: Stacey Robbins is a 52 y.o. female   Constipation is better with high-fiber diet and increased water intake.    No nausea, vomiting, heartburn (better with as needed Tums), regurgitation, odynophagia or dysphagia.  No significant diarrhea or constipation. No melena or hematochezia. No unintentional weight loss. No abdominal pain.   FH polyps- mom at 70   SH-daughter who works with school system. Would be off in June/July. Patient works with [employer]   Past Medical History:  Diagnosis Date   Diabetes type 2, controlled ([place])    History of gestational diabetes    Hypertension    Preeclampsia     Past Surgical History:  Procedure Laterality Date   CESAREAN SECTION     2004   TUBAL LIGATION     2004    Prior to Admission medications   Medication Sig Start Date End Date Taking? Authorizing Provider  atorvastatin (LIPITOR) 40 MG tablet TAKE 1 TABLET(40 MG) BY MOUTH DAILY 12/18/21  Yes Gerrit Heck, MD  Dulaglutide (TRULICITY) 4.5 0000000 SOPN Inject 4.5 mg as directed once a week. Replaces prescription for Ozempic 03/01/22  Yes Leavy Cella, RPH-CPP  Glycerin-Polysorbate 80 (REFRESH DRY EYE THERAPY OP) Place 1 drop into both eyes daily as needed (Dry eyes).   Yes [provider]  hydrochlorothiazide (HYDRODIURIL) 25 MG tablet TAKE 1 TABLET(25 MG) BY MOUTH EVERY MORNING 07/25/21  Yes Brimage, Vondra, DO  losartan (COZAAR) 100 MG tablet Take 1 tablet (100 mg total) by mouth at bedtime. 07/25/21  Yes Brimage, Ronnette Juniper, DO  Multiple Vitamins-Minerals (MULTIVITAMIN WITH MINERALS) tablet Take 1 tablet by mouth daily.   Yes [provider]  Naphazoline-Pheniramine (OPCON-A) 0.027-0.315 % SOLN Place 1 drop into both eyes daily as needed (Allergy eyes).   Yes [provider]   polyethylene glycol powder (GLYCOLAX/MIRALAX) 17 GM/SCOOP powder Take 17 g by mouth 2 (two) times daily as needed. 02/02/22  Yes Gerrit Heck, MD  spironolactone (ALDACTONE) 25 MG tablet TAKE 1 TABLET(25 MG) BY MOUTH AT BEDTIME 04/09/22  Yes Gerrit Heck, MD  cetirizine (ZYRTEC ALLERGY) 10 MG tablet Take 1 tablet (10 mg total) by mouth daily. Patient not taking: Reported on 06/27/2022 12/29/21   Gerrit Heck, MD  famotidine (PEPCID) 20 MG tablet TAKE 1 TABLET(20 MG) BY MOUTH TWICE DAILY Patient not taking: Reported on 06/27/2022 12/29/21   Gerrit Heck, MD    No current facility-administered medications for this encounter.    Allergies as of 03/26/2022 - Review Complete 03/13/2022  Allergen Reaction Noted   Lisinopril  12/28/2019   Shrimp [shellfish allergy]  03/04/2019    Family History  Problem Relation Age of Onset   Hypertension Mother    Diabetes Father    Hypertension Father    Breast cancer Maternal Aunt        33s   Cancer Maternal Aunt        breast cancer    Brain cancer Maternal Uncle    Prostate cancer Paternal Grandfather     Social History   Socioeconomic History   Marital status: Divorced    Spouse name: Harrell Gave   Number of children: 4   Years of education: Not on file   Highest education level: Not on file  Occupational History   Occupation: Hydrographic surveyor  Comment: [employer]  Tobacco Use   Smoking status: Never   Smokeless tobacco: Never  Vaping Use   Vaping Use: Never used  Substance and Sexual Activity   Alcohol use: No   Drug use: No   Sexual activity: Yes    Birth control/protection: Surgical  Other Topics Concern   Not on file  Social History Narrative    4 daughters, 3 are in college. Youngest daughter is 76   Social Determinants of Radio broadcast assistant Strain: Not on file  Food Insecurity: Not on file  Transportation Needs: Not on file  Physical Activity: Not on file  Stress: Not on file  Social  Connections: Not on file  Intimate Partner Violence: Not on file    Review of Systems: Positive for none All other review of systems negative except as mentioned in the HPI.  Physical Exam: Vital signs in last 24 hours:     General:   Alert,  Well-developed, well-nourished, pleasant and cooperative in NAD Lungs:  Clear throughout to auscultation.   Heart:  Regular rate and rhythm; no murmurs, clicks, rubs,  or gallops. Abdomen:  Soft, nontender and nondistended. Normal bowel sounds.   Neuro/Psych:  Alert and cooperative. Normal mood and affect. A and O x 3    No significant changes were identified.  The patient continues to be an appropriate candidate for the planned procedure and anesthesia.   Carmell Austria, MD. [facility] 06/28/2022 7:34 AM@

## 2022-06-28 NOTE — Discharge Instructions (Signed)

## 2022-06-30 ENCOUNTER — Encounter: Payer: Self-pay | Admitting: Gastroenterology

## 2022-06-30 LAB — SURGICAL PATHOLOGY

## 2022-07-01 ENCOUNTER — Encounter (HOSPITAL_COMMUNITY): Payer: Self-pay | Admitting: Gastroenterology

## 2022-07-03 ENCOUNTER — Other Ambulatory Visit: Payer: Self-pay | Admitting: Student

## 2022-07-03 DIAGNOSIS — I10 Essential (primary) hypertension: Secondary | ICD-10-CM

## 2022-07-04 ENCOUNTER — Encounter: Payer: Self-pay | Admitting: Student

## 2022-07-04 ENCOUNTER — Other Ambulatory Visit: Payer: Self-pay | Admitting: Student

## 2022-07-04 DIAGNOSIS — L732 Hidradenitis suppurativa: Secondary | ICD-10-CM

## 2022-07-04 DIAGNOSIS — I10 Essential (primary) hypertension: Secondary | ICD-10-CM

## 2022-07-06 MED ORDER — DOXYCYCLINE HYCLATE 100 MG PO TABS: 100.0000 mg | ORAL_TABLET | Freq: Two times a day (BID) | ORAL | 0 refills | Status: AC

## 2022-07-09 ENCOUNTER — Other Ambulatory Visit (HOSPITAL_COMMUNITY)
Admission: RE | Admit: 2022-07-09 | Discharge: 2022-07-09 | Disposition: A | Discharge status: Home / Self Care | Payer: BC Managed Care – PPO | Source: Ambulatory Visit | Attending: Obstetrics & Gynecology | Admitting: Obstetrics & Gynecology

## 2022-07-09 ENCOUNTER — Ambulatory Visit (HOSPITAL_BASED_OUTPATIENT_CLINIC_OR_DEPARTMENT_OTHER): Payer: BC Managed Care – PPO | Admitting: Obstetrics & Gynecology

## 2022-07-09 ENCOUNTER — Encounter (HOSPITAL_BASED_OUTPATIENT_CLINIC_OR_DEPARTMENT_OTHER): Payer: Self-pay | Admitting: Obstetrics & Gynecology

## 2022-07-09 VITALS — BP 137/88 | HR 106 | Ht 63.0 in | Wt 310.8 lb

## 2022-07-09 DIAGNOSIS — R87619 Unspecified abnormal cytological findings in specimens from cervix uteri: Secondary | ICD-10-CM

## 2022-07-09 DIAGNOSIS — N951 Menopausal and female climacteric states: Secondary | ICD-10-CM | POA: Diagnosis not present

## 2022-07-09 MED ORDER — PAROXETINE HCL 10 MG PO TABS: 10.0000 mg | ORAL_TABLET | ORAL | 2 refills | Status: DC

## 2022-07-09 NOTE — Progress Notes (Addendum)
GYNECOLOGY  VISIT  CC:   follow up pap, hot flashes and night sweats, mood changes  HPI: 52 y.o. [id number] Divorced Black or [place] American female here for repeat pap smear due to AGUS pap 04/27/2021.  She was evaluated with colposcopy which was normal.  ECC and endometrial biopsy were both obtained as well.  They were negative for abnormal cells.    LMP 08/2021.  She is now having hot flashes, night sweats, mood changes.  Diagnosis of menopause discussed.  She is concerned about being on hormonal therapy and would like to discuss alternative options. Gabapentin, veozah and SSRIs discussed.  Pt is having mood issues as well.  Would like to start SSRI.  Risks/side effects dsicussed.      Past Medical History:  Diagnosis Date   Diabetes type 2, controlled ([place])    History of gestational diabetes    Hypertension    Preeclampsia     MEDS:   Current Outpatient Medications on File Prior to Visit  Medication Sig Dispense Refill   atorvastatin (LIPITOR) 40 MG tablet TAKE 1 TABLET(40 MG) BY MOUTH DAILY 90 tablet 3   doxycycline (VIBRA-TABS) 100 MG tablet Take 1 tablet (100 mg total) by mouth 2 (two) times daily for 7 days. 14 tablet 0   Dulaglutide (TRULICITY) 4.5 0000000 SOPN Inject 4.5 mg as directed once a week. Replaces prescription for Ozempic 2 mL 6   Glycerin-Polysorbate 80 (REFRESH DRY EYE THERAPY OP) Place 1 drop into both eyes daily as needed (Dry eyes).     hydrochlorothiazide (HYDRODIURIL) 25 MG tablet TAKE 1 TABLET(25 MG) BY MOUTH EVERY MORNING 90 tablet 3   losartan (COZAAR) 100 MG tablet Take 1 tablet (100 mg total) by mouth at bedtime. 90 tablet 3   Multiple Vitamins-Minerals (MULTIVITAMIN WITH MINERALS) tablet Take 1 tablet by mouth daily.     Naphazoline-Pheniramine (OPCON-A) 0.027-0.315 % SOLN Place 1 drop into both eyes daily as needed (Allergy eyes).     polyethylene glycol powder (GLYCOLAX/MIRALAX) 17 GM/SCOOP powder Take 17 g by mouth 2 (two) times daily as needed. 3350 g 1    spironolactone (ALDACTONE) 25 MG tablet TAKE 1 TABLET(25 MG) BY MOUTH AT BEDTIME 90 tablet 0   cetirizine (ZYRTEC ALLERGY) 10 MG tablet Take 1 tablet (10 mg total) by mouth daily. (Patient not taking: Reported on 06/27/2022) 30 tablet 0   famotidine (PEPCID) 20 MG tablet TAKE 1 TABLET(20 MG) BY MOUTH TWICE DAILY (Patient not taking: Reported on 06/27/2022) 180 tablet 0   No current facility-administered medications on file prior to visit.    ALLERGIES: Lisinopril and Shrimp [shellfish allergy]  SH:  in relationship, non smoker  Review of Systems  Constitutional: Negative.   Genitourinary: Negative.     PHYSICAL EXAMINATION:    BP 137/88   Pulse (!) 106   Ht '5\' 3"'$  (1.6 m) Comment: reported  Wt (!) 310 lb 12.8 oz (141 kg)   BMI 55.06 kg/m     General appearance: alert, cooperative and appears stated age Lymph:  no inguinal LAD noted  Pelvic: External genitalia:  no lesions              Urethra:  normal appearing urethra with no masses, tenderness or lesions              Bartholins and Skenes: normal                 Vagina: normal appearing vagina with normal color and discharge, no lesions  Cervix: no lesions              Bimanual Exam:  Uterus:  normal size, contour, position, consistency, mobility, non-tender              Adnexa: no mass, fullness, tenderness  Chaperone, Octaviano Batty, CMA, was present for exam.  Assessment/Plan: 1. Atypical glandular cells of undetermined significance (AGUS) on cervical Pap smear - Cytology - PAP( [place])  2. Perimenopausal vasomotor symptoms - will try SSRI for treatment of symptoms.   - PARoxetine (PAXIL) 10 MG tablet; Take 1 tablet (10 mg total) by mouth every morning.  Dispense: 30 tablet; Refill: 2

## 2022-07-17 LAB — CYTOLOGY - PAP
Adequacy: ABSENT
Comment: NEGATIVE
Diagnosis: NEGATIVE
High risk HPV: NEGATIVE

## 2022-08-02 ENCOUNTER — Encounter: Payer: Self-pay | Admitting: Student

## 2022-08-02 ENCOUNTER — Other Ambulatory Visit: Payer: Self-pay | Admitting: Student

## 2022-08-02 ENCOUNTER — Other Ambulatory Visit: Payer: Self-pay

## 2022-08-02 DIAGNOSIS — I10 Essential (primary) hypertension: Secondary | ICD-10-CM

## 2022-08-02 MED ORDER — LOSARTAN POTASSIUM 100 MG PO TABS: 100.0000 mg | ORAL_TABLET | Freq: Every day | ORAL | 0 refills | Status: DC

## 2022-08-09 ENCOUNTER — Encounter: Payer: Self-pay | Admitting: Student

## 2022-08-09 ENCOUNTER — Ambulatory Visit: Payer: BC Managed Care – PPO | Admitting: Student

## 2022-08-09 VITALS — BP 137/96 | HR 95 | Ht 63.0 in | Wt 312.0 lb

## 2022-08-09 DIAGNOSIS — I10 Essential (primary) hypertension: Secondary | ICD-10-CM | POA: Diagnosis not present

## 2022-08-09 DIAGNOSIS — Z794 Long term (current) use of insulin: Secondary | ICD-10-CM | POA: Diagnosis not present

## 2022-08-09 DIAGNOSIS — E1165 Type 2 diabetes mellitus with hyperglycemia: Secondary | ICD-10-CM

## 2022-08-09 LAB — POCT GLYCOSYLATED HEMOGLOBIN (HGB A1C): HbA1c, POC (controlled diabetic range): 6 % (ref 0.0–7.0)

## 2022-08-09 MED ORDER — EMPAGLIFLOZIN 10 MG PO TABS: 10.0000 mg | ORAL_TABLET | Freq: Every day | ORAL | 0 refills | Status: DC

## 2022-08-09 NOTE — Assessment & Plan Note (Addendum)
Well-controlled today with A1c of 6.0.  Has been having difficulties obtaining Trulicity and would like a different medication.  Counseled patient on metformin and SGLT2 medications.  She would like to trial SGLT2.  Discussed the risk of UTI, yeast infection, euglycemic DKA. -Stop Trulicity -Start Jardiance -microalbumin/creatinine -Repeat BMP in 1 month

## 2022-08-09 NOTE — Progress Notes (Signed)
    SUBJECTIVE:   CHIEF COMPLAINT / HPI: Blood pressure follow-up  Hypertension: Patient is a 52 y.o. female who present today for follow up of hypertension.   Patient endorses no problems  Home medications include: HCTZ 25 mg daily, losartan 100 mg daily, spironolactone 25 mg daily Patient endorses taking these medications as prescribed. Denies any headache, vision changes, shortness of breath, lower extremity swelling or chest pain   Most recent creatinine trend:  Lab Results  Component Value Date   CREATININE 0.76 02/02/2022   CREATININE 0.67 01/12/2022   CREATININE 0.73 12/29/2021   Patient does not check blood pressure at home.  Patient has had a BMP in the past 1 year.  Diabetic Follow Up: Patient is a 51 y.o. female who present today for diabetic follow up.   Patient endorses  difficulties obtaining Trulicity, has been out of it for the last couple of weeks and would like to switch to an oral medication.  Does not want to be on metformin.  Home medications include: Trulicity 4.5 mg weekly-difficulty getting this ACEi/ARB: yes Statin: yes Patient endorses taking these medications as prescribed.  Most recent A1Cs:  Lab Results  Component Value Date   HGBA1C 6.0 08/09/2022   HGBA1C 5.7 12/29/2021   HGBA1C 5.7 06/27/2021   Last Microalbumin, LDL, Creatinine: Lab Results  Component Value Date   LDLCALC 90 12/27/2020   CREATININE 0.76 02/02/2022   Patient is not up to date on diabetic eye-she will send her visit information here  PERTINENT  PMH / PSH:   OBJECTIVE:   BP (!) 137/96   Pulse 95   Ht 5\' 3"  (1.6 m)   Wt (!) 312 lb (141.5 kg)   SpO2 98%   BMI 55.27 kg/m   General: Well appearing, NAD, awake, alert, responsive to questions Head: Normocephalic atraumatic CV: Regular rate and rhythm no murmurs rubs or gallops Respiratory: Clear to ausculation bilaterally, no wheezes rales or crackles, chest rises symmetrically,  no increased work of  breathing ASSESSMENT/PLAN:   Essential hypertension, benign On recheck was 137/96.  We will start an SGLT2 inhibitor which may have slight BP decrease as well. -Continue HCTZ 25 mg daily -Continue losartan 100 mg daily -Continue spironolactone 25 mg daily -Monitor at future visit  Type 2 diabetes mellitus with hyperglycemia ([place]) Well-controlled today with A1c of 6.0.  Has been having difficulties obtaining Trulicity and would like a different medication.  Counseled patient on metformin and SGLT2 medications.  She would like to trial SGLT2.  Discussed the risk of UTI, yeast infection, euglycemic DKA. -Stop Trulicity -Start Jardiance -Repeat BMP in 1 month   Gerrit Heck, MD [place]

## 2022-08-09 NOTE — Assessment & Plan Note (Signed)
On recheck was 137/96.  We will start an SGLT2 inhibitor which may have slight BP decrease as well. -Continue HCTZ 25 mg daily -Continue losartan 100 mg daily -Continue spironolactone 25 mg daily -Monitor at future visit

## 2022-08-09 NOTE — Patient Instructions (Addendum)
It was great to see you! Thank you for allowing me to participate in your care!   Our plans for today:  -We will switch your Trulicity to an SGLT2 inhibitor called jardiance-you can take this daily and come back in 1 month for a BMP and BP recheck -The main side effects of the SGLT2 inhibitors are UTI/yeast infections although your A1c is so well-controlled currently, and euglycemic DKA which is rare but a side effect to be counseled about  Take care and seek immediate care sooner if you develop any concerns.  Gerrit Heck, MD

## 2022-08-10 LAB — MICROALBUMIN / CREATININE URINE RATIO
Creatinine, Urine: 98.9 mg/dL
Microalb/Creat Ratio: 6 mg/g creat (ref 0–29)
Microalbumin, Urine: 6.3 ug/mL

## 2022-09-07 ENCOUNTER — Ambulatory Visit: Payer: BC Managed Care – PPO | Admitting: Student

## 2022-09-13 ENCOUNTER — Telehealth: Payer: Self-pay

## 2022-09-13 NOTE — Progress Notes (Signed)
Patient attempted to be outreached by Rosaland Shiffman, PharmD Candidate on 09/13/22 to discuss hypertension. Left voicemail for patient to return our call at their convenience at [phone].  Levy Wellman, Student-PharmD  

## 2022-09-14 ENCOUNTER — Ambulatory Visit (INDEPENDENT_AMBULATORY_CARE_PROVIDER_SITE_OTHER): Payer: BC Managed Care – PPO | Admitting: Student

## 2022-09-14 ENCOUNTER — Encounter: Payer: Self-pay | Admitting: Student

## 2022-09-14 ENCOUNTER — Ambulatory Visit: Payer: BC Managed Care – PPO | Attending: Family Medicine

## 2022-09-14 VITALS — BP 118/89 | HR 109 | Ht 63.0 in | Wt 314.0 lb

## 2022-09-14 DIAGNOSIS — R000 Tachycardia, unspecified: Secondary | ICD-10-CM

## 2022-09-14 DIAGNOSIS — L732 Hidradenitis suppurativa: Secondary | ICD-10-CM | POA: Diagnosis not present

## 2022-09-14 DIAGNOSIS — I10 Essential (primary) hypertension: Secondary | ICD-10-CM

## 2022-09-14 MED ORDER — DOXYCYCLINE HYCLATE 100 MG PO TABS: 100.0000 mg | ORAL_TABLET | Freq: Every day | ORAL | 0 refills | Status: DC

## 2022-09-14 MED ORDER — CHLORHEXIDINE GLUCONATE 4 % EX SOLN: Freq: Every day | CUTANEOUS | 0 refills | Status: DC | PRN

## 2022-09-14 NOTE — Assessment & Plan Note (Addendum)
Has consistently been tachycardic here.  Does have intermittent fluttering.  Will send in a Zio patch for patient to wear for 2 weeks to determine if there is any arrhythmias/PVC burden.  CBC and TSH within normal limits. -Zio patch

## 2022-09-14 NOTE — Assessment & Plan Note (Addendum)
At goal of 118/89. -Continue HCTZ, losartan, spironolactone and jardiance (for diabetes) -BMP

## 2022-09-14 NOTE — Patient Instructions (Signed)
It was great to see you! Thank you for allowing me to participate in your care!   Our plans for today:  - We are going to get a zio patch to see what your heart rate is daily for the next 14 days and just a sticker and a cardiologist will read what your heart is doing -Continue your medications as you have been taking them, we will check your labs today -I have sent in doxycycline to take once a day to help prevent recurring boils  Take care and seek immediate care sooner if you develop any concerns.  Levin Erp, MD

## 2022-09-14 NOTE — Progress Notes (Unsigned)
Enrolled patient for a 14 day Zio XT monitor to be mailed to patients home  DOD to read 

## 2022-09-14 NOTE — Progress Notes (Signed)
    SUBJECTIVE:   CHIEF COMPLAINT / HPI:   Hypertension: Patient is a 52 y.o. female who present today for follow up of hypertension.   Patient endorses no problems  Home medications include: Jardiance 10 mg daily (started for diabetes at last visit-no issues), hydrochlorothiazide 25 mg daily, losartan 100 mg daily, spironolactone 25 mg daily Patient endorses taking these medications as prescribed.  Most recent creatinine trend:  Lab Results  Component Value Date   CREATININE 0.76 02/02/2022   CREATININE 0.67 01/12/2022   CREATININE 0.73 12/29/2021   Patient does not check blood pressure at home.  Patient has had a BMP in the past 1 year.  Hidradenitis supparavita Patient gets boils in groin and armpits frequently.  She gets them monthly flares.  Goes away quickly with antibiotics but has been dealing with this for a while.  Tachycardia Has consistently been tachycardic at visits.  Had an EKG 01/2022 which showed some PVCs.  She does intermittently have some fluttering but otherwise is asymptomatic.  PERTINENT  PMH / PSH:   OBJECTIVE:   BP 118/89   Pulse (!) 109   Ht 5\' 3"  (1.6 m)   Wt (!) 314 lb (142.4 kg)   LMP 12/01/2020 Comment: tubes tied  SpO2 100%   BMI 55.62 kg/m   General: Well appearing, NAD, awake, alert, responsive to questions Head: Normocephalic atraumatic CV: Regular rate and rhythm no murmurs rubs or gallops Respiratory: Clear to ausculation bilaterally, no wheezes rales or crackles, chest rises symmetrically,  no increased work of breathing Extremities: Moves upper and lower extremities freely, no edema in LE  ASSESSMENT/PLAN:   Essential hypertension, benign At goal of 118/89. -Continue HCTZ, losartan, spironolactone and jardiance (for diabetes) -BMP  Hidradenitis suppurativa Having frequent flareups monthly.  We discussed starting a prophylactic doxycycline dose and patient was amenable to this.  Will also prescribe Hibiclens to place on the  areas that she frequently gets this. -Doxycycline 100 mg daily -Hibiclens daily  Tachycardia Has consistently been tachycardic here.  Does have intermittent fluttering.  Will send in a Zio patch for patient to wear for 2 weeks to determine if there is any arrhythmias/PVC burden.  CBC and TSH within normal limits. -Zio patch   Levin Erp, MD [facility]

## 2022-09-14 NOTE — Assessment & Plan Note (Signed)
Having frequent flareups monthly.  We discussed starting a prophylactic doxycycline dose and patient was amenable to this.  Will also prescribe Hibiclens to place on the areas that she frequently gets this. -Doxycycline 100 mg daily -Hibiclens daily

## 2022-09-15 LAB — BASIC METABOLIC PANEL
BUN/Creatinine Ratio: 15 (ref 9–23)
BUN: 13 mg/dL (ref 6–24)
CO2: 24 mmol/L (ref 20–29)
Calcium: 10.1 mg/dL (ref 8.7–10.2)
Chloride: 100 mmol/L (ref 96–106)
Creatinine, Ser: 0.88 mg/dL (ref 0.57–1.00)
Glucose: 119 mg/dL — ABNORMAL HIGH (ref 70–99)
Potassium: 4.3 mmol/L (ref 3.5–5.2)
Sodium: 139 mmol/L (ref 134–144)
eGFR: 81 mL/min/{1.73_m2} (ref >59)

## 2022-09-23 DIAGNOSIS — R000 Tachycardia, unspecified: Secondary | ICD-10-CM | POA: Diagnosis not present

## 2022-10-02 ENCOUNTER — Other Ambulatory Visit: Payer: Self-pay | Admitting: Student

## 2022-10-02 DIAGNOSIS — I10 Essential (primary) hypertension: Secondary | ICD-10-CM

## 2022-10-03 ENCOUNTER — Encounter: Payer: Self-pay | Admitting: Student

## 2022-10-03 DIAGNOSIS — I10 Essential (primary) hypertension: Secondary | ICD-10-CM

## 2022-10-04 MED ORDER — HYDROCHLOROTHIAZIDE 25 MG PO TABS: ORAL_TABLET | ORAL | 1 refills | Status: DC

## 2022-10-04 MED ORDER — SPIRONOLACTONE 25 MG PO TABS: ORAL_TABLET | ORAL | 1 refills | Status: DC

## 2022-10-08 ENCOUNTER — Other Ambulatory Visit (HOSPITAL_BASED_OUTPATIENT_CLINIC_OR_DEPARTMENT_OTHER): Payer: Self-pay | Admitting: Obstetrics & Gynecology

## 2022-10-08 DIAGNOSIS — N951 Menopausal and female climacteric states: Secondary | ICD-10-CM

## 2022-10-11 NOTE — Telephone Encounter (Signed)
LMOVM for pt to call regarding refill request 

## 2022-11-04 ENCOUNTER — Other Ambulatory Visit: Payer: Self-pay | Admitting: Student

## 2022-11-04 DIAGNOSIS — E1165 Type 2 diabetes mellitus with hyperglycemia: Secondary | ICD-10-CM

## 2022-12-10 ENCOUNTER — Other Ambulatory Visit: Payer: Self-pay | Admitting: Student

## 2022-12-10 DIAGNOSIS — I10 Essential (primary) hypertension: Secondary | ICD-10-CM

## 2022-12-17 ENCOUNTER — Other Ambulatory Visit: Payer: Self-pay | Admitting: Student

## 2023-02-04 ENCOUNTER — Ambulatory Visit (INDEPENDENT_AMBULATORY_CARE_PROVIDER_SITE_OTHER): Payer: BC Managed Care – PPO | Admitting: Student

## 2023-02-04 ENCOUNTER — Other Ambulatory Visit (HOSPITAL_COMMUNITY): Payer: Self-pay

## 2023-02-04 ENCOUNTER — Encounter: Payer: Self-pay | Admitting: Student

## 2023-02-04 VITALS — BP 127/80 | HR 99 | Temp 98.8°F | Ht 63.0 in | Wt 319.6 lb

## 2023-02-04 DIAGNOSIS — L732 Hidradenitis suppurativa: Secondary | ICD-10-CM | POA: Diagnosis not present

## 2023-02-04 DIAGNOSIS — Z6841 Body Mass Index (BMI) 40.0 and over, adult: Secondary | ICD-10-CM

## 2023-02-04 DIAGNOSIS — Z794 Long term (current) use of insulin: Secondary | ICD-10-CM

## 2023-02-04 DIAGNOSIS — I10 Essential (primary) hypertension: Secondary | ICD-10-CM

## 2023-02-04 DIAGNOSIS — E1165 Type 2 diabetes mellitus with hyperglycemia: Secondary | ICD-10-CM

## 2023-02-04 LAB — POCT GLYCOSYLATED HEMOGLOBIN (HGB A1C): HbA1c, POC (controlled diabetic range): 6.8 % (ref 0.0–7.0)

## 2023-02-04 MED ORDER — SEMAGLUTIDE(0.25 OR 0.5MG/DOS) 2 MG/3ML ~~LOC~~ SOPN
0.2500 mg | PEN_INJECTOR | SUBCUTANEOUS | 0 refills | Status: DC
Start: 2023-02-04 — End: 2023-02-04

## 2023-02-04 MED ORDER — EMPAGLIFLOZIN 10 MG PO TABS: 10.0000 mg | ORAL_TABLET | Freq: Every day | ORAL | 0 refills | Status: DC

## 2023-02-04 MED ORDER — EMPAGLIFLOZIN 10 MG PO TABS
10.0000 mg | ORAL_TABLET | Freq: Every day | ORAL | 0 refills | Status: DC
  Filled 2023-02-04: qty 90, 90d supply, fill #0

## 2023-02-04 MED ORDER — DOXYCYCLINE HYCLATE 100 MG PO TABS: 100.0000 mg | ORAL_TABLET | Freq: Every day | ORAL | 3 refills | Status: DC

## 2023-02-04 MED ORDER — SEMAGLUTIDE(0.25 OR 0.5MG/DOS) 2 MG/3ML ~~LOC~~ SOPN
0.2500 mg | PEN_INJECTOR | SUBCUTANEOUS | 0 refills | Status: DC
Start: 2023-02-04 — End: 2023-03-25
  Filled 2023-02-04: qty 3, 56d supply, fill #0

## 2023-02-04 NOTE — Assessment & Plan Note (Signed)
A1c is 6.8. -Continue Jardiance 10 mg daily -Start semaglutide 0.25 mg weekly

## 2023-02-04 NOTE — Assessment & Plan Note (Signed)
Declines bariatric surgery, no issues with GLP-1 besides supply issues at that time.  Willing to retry this again. -Restart semaglutide

## 2023-02-04 NOTE — Patient Instructions (Addendum)
It was great to see you! Thank you for allowing me to participate in your care!   Our plans for today:  - started semaglutide 0.25 mg weekly for 4 weeks then 0.5 mg weekly for 4 weeks - let me know if the wheezing continues past 4 weeks - we will check labs today - sent doxycycline daily  Today at your annual preventive visit we talked about the following measures:   I recommend 150 minutes of exercise per week-try 30 minutes 5 days per week We discussed reducing sugary beverages (like soda and juice) and increasing leafy greens and whole fruits.  We discussed avoiding tobacco and alcohol.  I recommend avoiding illicit substances.   Take care and seek immediate care sooner if you develop any concerns.  Levin Erp, MD

## 2023-02-04 NOTE — Assessment & Plan Note (Signed)
Continues to have recurrent boils. Has been out of prophylactic doxycycline. -Refill doxycyline -If not improved recommended dermatology referral

## 2023-02-04 NOTE — Progress Notes (Signed)
SUBJECTIVE:   Chief compliant/HPI: annual examination  Stacey Robbins is a 52 y.o. who presents today for an annual exam.   Wheezing 2 weeks has been having intermittent wheezing. No cough/fevers. States she gets this when she has gained weight. No chest pain.  HS On prophylactic doxycyline but ran out of refills. Has recurrent boils  Morbid obesity BMI 56.  Has previously tried Trulicity and semaglutide however has had issues filling these.  Last time she was on this was around April.  She had good success with Ozempic with losing around 14 pounds while she was on it.  She does not want to do gastric sleeve surgery.  Her insurance does not cover Mounjaro.  HTN Currently on hydrochlorothiazide 25,losartan 100, spironolactone 25.  She checks blood pressures at home and they are around systolics 110s.  T2DM Well controlled. Meds include jardiance 10 daily. Lab Results  Component Value Date   HGBA1C 6.8 02/04/2023   HGBA1C 6.0 08/09/2022   HGBA1C 5.7 12/29/2021   History tabs reviewed and updated.   OBJECTIVE:   BP 127/80   Pulse 99   Temp 98.8 F (37.1 C)   Ht 5\' 3"  (1.6 m)   Wt (!) 319 lb 9.6 oz (145 kg)   LMP 12/01/2020 Comment: tubes tied  SpO2 98%   BMI 56.61 kg/m   General: Well appearing, NAD, awake, alert, responsive to questions Head: Normocephalic atraumatic, no neck masses CV: Regular rate and rhythm no murmurs rubs or gallops Respiratory: Clear to ausculation bilaterally, no wheezes rales or crackles, chest rises symmetrically,  no increased work of breathing Abdomen: Soft, non-tender, non-distended, normoactive bowel sounds  Extremities: Moves upper and lower extremities freely, no edema in LE Neuro: No focal deficits Skin: No rashes or lesions visualized   ASSESSMENT/PLAN:   Essential hypertension, benign Patient's blood pressure is controlled today. BP: 127/80. Goal of 130/80. Patient's medication regimen includes hydrochlorothiazide 25,losartan  100, spironolactone 25. -Changes to current regimen include none -Labs: BMP  Hidradenitis suppurativa Continues to have recurrent boils. Has been out of prophylactic doxycycline. -Refill doxycyline -If not improved recommended dermatology referral  BMI 50.0-59.9, adult (HCC) Declines bariatric surgery, no issues with GLP-1 besides supply issues at that time.  Willing to retry this again. -Restart semaglutide  Type 2 diabetes mellitus with hyperglycemia (HCC) A1c is 6.8. -Continue Jardiance 10 mg daily -Start semaglutide 0.25 mg weekly   Wheezing Benign lung exam. No infectious symptoms. ?Seasonal changes. Discussed if continuing >4 weeks return for PFTs.  Annual Examination  See AVS for age appropriate recommendations  PHQ score    09/14/2022    3:16 PM 08/09/2022    9:40 AM 07/09/2022    9:28 AM 02/02/2022    9:39 AM 01/12/2022    2:23 PM  Depression screen PHQ 2/9  Decreased Interest 0 0 0 0 0  Down, Depressed, Hopeless 0 0 0 0 0  PHQ - 2 Score 0 0 0 0 0  Altered sleeping 0 1  0 0  Tired, decreased energy 0 0  0 0  Change in appetite 1 0  0 1  Feeling bad or failure about yourself  0 0  0 0  Trouble concentrating 0 0  0 0  Moving slowly or fidgety/restless 0 0  0 0  Suicidal thoughts 0 0  0 0  PHQ-9 Score 1 1  0 1  Difficult doing work/chores     Not difficult at all    BP reviewed  and at goal.   Considered the following items based upon USPSTF recommendations: Diabetes screening: ordered Screening for elevated cholesterol: ordered HIV testing: not ordered Hepatitis C:  not ordered Hepatitis B:  not ordered Syphilis if at high risk:  not ordered GC/CT  not ordered Osteoporosis screening considered based upon risk of fracture from [facility] calculator. Major osteoporotic fracture risk is unable to calculate d/t weight. DEXA not ordered.   Cervical cancer screening: prior Pap reviewed, repeat due in 2025 Breast cancer screening: discussed and ordered mammogram based upon  personal history  Colorectal cancer screening: up to date on screening for CRC. Lung cancer screening: done- never smoker Vaccinations declines   Follow up in 1 year or sooner if indicated.   Levin Erp, MD [facility]

## 2023-02-04 NOTE — Assessment & Plan Note (Signed)
Patient's blood pressure is controlled today. BP: 127/80. Goal of 130/80. Patient's medication regimen includes hydrochlorothiazide 25,losartan 100, spironolactone 25. -Changes to current regimen include none -Labs: BMP

## 2023-02-05 ENCOUNTER — Encounter: Payer: Self-pay | Admitting: Student

## 2023-02-05 DIAGNOSIS — L732 Hidradenitis suppurativa: Secondary | ICD-10-CM

## 2023-02-05 LAB — LIPID PANEL
Chol/HDL Ratio: 3.1 {ratio} (ref 0.0–4.4)
Cholesterol, Total: 144 mg/dL (ref 100–199)
HDL: 47 mg/dL (ref >39)
LDL Chol Calc (NIH): 81 mg/dL (ref 0–99)
Triglycerides: 81 mg/dL (ref 0–149)
VLDL Cholesterol Cal: 16 mg/dL (ref 5–40)

## 2023-02-05 LAB — BASIC METABOLIC PANEL
BUN/Creatinine Ratio: 16 (ref 9–23)
BUN: 12 mg/dL (ref 6–24)
CO2: 24 mmol/L (ref 20–29)
Calcium: 10 mg/dL (ref 8.7–10.2)
Chloride: 102 mmol/L (ref 96–106)
Creatinine, Ser: 0.73 mg/dL (ref 0.57–1.00)
Glucose: 121 mg/dL — ABNORMAL HIGH (ref 70–99)
Potassium: 4.1 mmol/L (ref 3.5–5.2)
Sodium: 141 mmol/L (ref 134–144)
eGFR: 100 mL/min/{1.73_m2} (ref >59)

## 2023-02-05 MED ORDER — CHLORHEXIDINE GLUCONATE 4 % EX SOLN: Freq: Every day | CUTANEOUS | 0 refills | Status: AC | PRN

## 2023-02-06 MED ORDER — MUPIROCIN 2 % EX OINT
1.0000 | TOPICAL_OINTMENT | Freq: Two times a day (BID) | CUTANEOUS | 0 refills | Status: DC | PRN
Start: 2023-02-06 — End: 2023-04-29

## 2023-02-06 NOTE — Addendum Note (Signed)
Addended by: Levin Erp on: 02/06/2023 12:27 PM   Modules accepted: Orders

## 2023-03-10 ENCOUNTER — Other Ambulatory Visit: Payer: Self-pay | Admitting: Student

## 2023-03-11 ENCOUNTER — Other Ambulatory Visit: Payer: Self-pay | Admitting: Student

## 2023-03-15 ENCOUNTER — Encounter: Payer: Self-pay | Admitting: Student

## 2023-03-15 MED ORDER — BENZONATATE 100 MG PO CAPS: 100.0000 mg | ORAL_CAPSULE | Freq: Two times a day (BID) | ORAL | 0 refills | Status: DC | PRN

## 2023-03-25 ENCOUNTER — Other Ambulatory Visit: Payer: Self-pay | Admitting: Student

## 2023-03-25 DIAGNOSIS — Z1231 Encounter for screening mammogram for malignant neoplasm of breast: Secondary | ICD-10-CM

## 2023-03-25 DIAGNOSIS — E1165 Type 2 diabetes mellitus with hyperglycemia: Secondary | ICD-10-CM

## 2023-03-25 DIAGNOSIS — I10 Essential (primary) hypertension: Secondary | ICD-10-CM

## 2023-03-26 ENCOUNTER — Other Ambulatory Visit (HOSPITAL_COMMUNITY): Payer: Self-pay

## 2023-03-26 MED ORDER — OZEMPIC (0.25 OR 0.5 MG/DOSE) 2 MG/3ML ~~LOC~~ SOPN
0.2500 mg | PEN_INJECTOR | SUBCUTANEOUS | 0 refills | Status: DC
  Filled 2023-03-26: qty 3, 56d supply, fill #0

## 2023-04-19 ENCOUNTER — Ambulatory Visit
Admission: RE | Admit: 2023-04-19 | Discharge: 2023-04-19 | Disposition: A | Discharge status: Home / Self Care | Payer: BC Managed Care – PPO | Source: Ambulatory Visit | Attending: Neurology | Admitting: Neurology

## 2023-04-19 DIAGNOSIS — Z1231 Encounter for screening mammogram for malignant neoplasm of breast: Secondary | ICD-10-CM

## 2023-04-29 ENCOUNTER — Other Ambulatory Visit: Payer: Self-pay | Admitting: Student

## 2023-04-29 ENCOUNTER — Encounter: Payer: Self-pay | Admitting: Student

## 2023-04-29 DIAGNOSIS — L732 Hidradenitis suppurativa: Secondary | ICD-10-CM

## 2023-04-29 NOTE — Telephone Encounter (Signed)
 Care team updated and letter sent for eye exam notes.

## 2023-05-03 ENCOUNTER — Other Ambulatory Visit: Payer: Self-pay | Admitting: Student

## 2023-05-03 DIAGNOSIS — Z794 Long term (current) use of insulin: Secondary | ICD-10-CM

## 2023-05-13 ENCOUNTER — Other Ambulatory Visit: Payer: Self-pay | Admitting: Student

## 2023-05-13 ENCOUNTER — Encounter: Payer: Self-pay | Admitting: Student

## 2023-05-13 DIAGNOSIS — E1165 Type 2 diabetes mellitus with hyperglycemia: Secondary | ICD-10-CM

## 2023-05-14 ENCOUNTER — Other Ambulatory Visit (HOSPITAL_COMMUNITY): Payer: Self-pay

## 2023-05-14 ENCOUNTER — Encounter (HOSPITAL_COMMUNITY): Payer: Self-pay

## 2023-05-21 ENCOUNTER — Ambulatory Visit: Payer: 59 | Admitting: Student

## 2023-05-21 ENCOUNTER — Other Ambulatory Visit (HOSPITAL_COMMUNITY): Payer: Self-pay

## 2023-05-21 ENCOUNTER — Other Ambulatory Visit: Payer: Self-pay | Admitting: Student

## 2023-05-21 ENCOUNTER — Encounter: Payer: Self-pay | Admitting: Student

## 2023-05-21 VITALS — BP 135/60 | HR 104 | Ht 63.0 in | Wt 316.0 lb

## 2023-05-21 DIAGNOSIS — K219 Gastro-esophageal reflux disease without esophagitis: Secondary | ICD-10-CM

## 2023-05-21 DIAGNOSIS — G43809 Other migraine, not intractable, without status migrainosus: Secondary | ICD-10-CM | POA: Diagnosis not present

## 2023-05-21 DIAGNOSIS — E1165 Type 2 diabetes mellitus with hyperglycemia: Secondary | ICD-10-CM | POA: Diagnosis not present

## 2023-05-21 DIAGNOSIS — Z794 Long term (current) use of insulin: Secondary | ICD-10-CM

## 2023-05-21 LAB — POCT SEDIMENTATION RATE: POCT SED RATE: 16 mm/h (ref 0–22)

## 2023-05-21 MED ORDER — FAMOTIDINE 40 MG PO TABS: 40.0000 mg | ORAL_TABLET | Freq: Every day | ORAL | 0 refills | Status: DC

## 2023-05-21 MED ORDER — OZEMPIC (0.25 OR 0.5 MG/DOSE) 2 MG/3ML ~~LOC~~ SOPN
0.2500 mg | PEN_INJECTOR | SUBCUTANEOUS | 0 refills | Status: DC
  Filled 2023-05-21: qty 3, 56d supply, fill #0

## 2023-05-21 MED ORDER — FAMOTIDINE 40 MG PO TABS
40.0000 mg | ORAL_TABLET | Freq: Every day | ORAL | 0 refills | Status: DC
  Filled 2023-05-21: qty 30, 30d supply, fill #0

## 2023-05-21 MED ORDER — OZEMPIC (0.25 OR 0.5 MG/DOSE) 2 MG/3ML ~~LOC~~ SOPN: 0.2500 mg | PEN_INJECTOR | SUBCUTANEOUS | 0 refills | Status: DC

## 2023-05-21 NOTE — Progress Notes (Signed)
    SUBJECTIVE:   CHIEF COMPLAINT / HPI: Reflux and Headache  Reflux The reflux symptoms have been present for years but have recently worsened. The patient describes the reflux as a sensation in the middle of chest and feeling gassy. The symptoms are triggered by certain foods, such as onions and tomato sauce. She has been managing the symptoms with an over-the-counter acid reducer which relieves the symptoms. She is able to have a bowel movement once or twice a day without straining. She has been on Ozempic  for diabetes since September, after previously being on Trulicity  with no recent dose changes (still on 0.25). Never smoker.  Headache She also reports a new headache, localized to the right temple, which has been present since summer 2024. The headache is different from the patient's usual migraines and is associated with a sensation of pulsation and tenderness over the temple on the right side. She denies any associated vomiting or vision changes. States it feels somewhat different than her normal migraines with occur all over her head instead of right side alone.  PERTINENT  PMH / PSH: HTN, T2DM  OBJECTIVE:   BP 135/60   Pulse (!) 104   Ht 5' 3 (1.6 m)   Wt (!) 316 lb (143.3 kg)   LMP 12/01/2020 Comment: tubes tied  SpO2 97%   BMI 55.98 kg/m   General: Well appearing, NAD, awake, alert, responsive to questions Head: Normocephalic atraumatic, no obvious issues with fundus, mild tenderness over right temple CV: Regular rate and rhythm no murmurs rubs or gallops Respiratory: Clear to ausculation bilaterally, no wheezes rales or crackles, chest rises symmetrically Abdomen: Soft, non-tender, non-distended, normoactive bowel sounds  Extremities: Moves upper and lower extremities freely Neuro: CN II: PERRL CN III, IV,VI: EOMI CV V: Normal sensation in V1, V2, V3 CVII: Symmetric smile and brow raise CN VIII: Normal hearing CN IX,X: Symmetric palate raise  CN XI: 5/5 shoulder  shrug CN XII: Symmetric tongue protrusion  UE and LE strength 5/5 Normal sensation in UE and LE bilaterally   ASSESSMENT/PLAN:   Assessment & Plan Gastroesophageal reflux disease, unspecified whether esophagitis present Worsening symptoms over the past week, possibly related to GLP-1 use.  -Start Famotidine  daily -Keep a food diary to identify potential dietary triggers -Consider discontinuing Ozempic  if symptoms do not improve -1 month f/u, consider GI referral if not improving for worsening/new reflux in pt over 50 Other migraine without status migrainosus, not intractable New onset of right-sided temporal headaches since the summer, different from her usual migraines. Tenderness over the right temporal artery. No associated vomiting or vision changes. POC ESR wnl (called patient and informed), reassuring against arteritis picture and would be unlikely given length of symptoms. -Keep a headache diary noting triggers, location, medications and associated symptoms. -Follow up in 1 month     Wendel Lesch, MD [facility]

## 2023-05-21 NOTE — Patient Instructions (Addendum)
 It was great to see you! Thank you for allowing me to participate in your care!   Our plans for today:  - I put pepcid  40 mg daily, I want you to keep a food log and write when symptoms occur - For headache we are getting a lab test for inflammatory marker today, I want to follow up on you in 1 month for both symptoms  Take care and seek immediate care sooner if you develop any concerns.  Wendel Lesch, MD

## 2023-05-28 ENCOUNTER — Other Ambulatory Visit: Payer: Self-pay | Admitting: Medical Genetics

## 2023-05-30 ENCOUNTER — Other Ambulatory Visit: Payer: Self-pay

## 2023-05-31 ENCOUNTER — Other Ambulatory Visit
Admission: RE | Admit: 2023-05-31 | Discharge: 2023-05-31 | Disposition: A | Discharge status: Home / Self Care | Payer: Self-pay | Source: Ambulatory Visit | Attending: Medical Genetics | Admitting: Medical Genetics

## 2023-06-03 ENCOUNTER — Encounter: Payer: Self-pay | Admitting: Student

## 2023-06-03 DIAGNOSIS — L732 Hidradenitis suppurativa: Secondary | ICD-10-CM

## 2023-06-04 MED ORDER — CHLORHEXIDINE GLUCONATE 2 % EX PADS: 1.0000 | MEDICATED_PAD | Freq: Every day | CUTANEOUS | 1 refills | Status: AC

## 2023-06-09 ENCOUNTER — Other Ambulatory Visit: Payer: Self-pay | Admitting: Student

## 2023-06-09 DIAGNOSIS — I10 Essential (primary) hypertension: Secondary | ICD-10-CM

## 2023-06-11 LAB — GENECONNECT MOLECULAR SCREEN: Genetic Analysis Overall Interpretation: NEGATIVE

## 2023-06-19 ENCOUNTER — Ambulatory Visit: Payer: 59 | Admitting: Student

## 2023-06-19 DIAGNOSIS — E1165 Type 2 diabetes mellitus with hyperglycemia: Secondary | ICD-10-CM

## 2023-06-19 DIAGNOSIS — Z87898 Personal history of other specified conditions: Secondary | ICD-10-CM

## 2023-06-19 DIAGNOSIS — K219 Gastro-esophageal reflux disease without esophagitis: Secondary | ICD-10-CM | POA: Diagnosis not present

## 2023-06-19 NOTE — Assessment & Plan Note (Signed)
On ozempic and jardiance. -Increase ozempic for weight control to 9 clicks past 0.25 mg for 2 weeks and then increase to 0.5 mg after if tolerating well

## 2023-06-19 NOTE — Progress Notes (Addendum)
[facility] Telemedicine Visit  Patient consented to have virtual visit and was identified by name and date of birth. Method of visit: Telephone  Encounter participants: Patient: Stacey Robbins - located at office Provider: Levin Erp - located at [facility]  Chief Complaint: Follow up  HPI: Here for follow-up of reflux symptoms and headaches Has been doing a lot better Acid reflux overall resolved with pepcid Onions and tomato sauces trigger Tolerating 0.25 mg ozempic weekly, interested in increasing  Headaches a lot better - notices it is worse with decreased sleep. Better with hydration and eating better   ROS: per HPI  Pertinent PMHx: HTN  Exam:  LMP 12/01/2020 Comment: tubes tied  Respiratory: speaking full sentences  Assessment/Plan:  Assessment & Plan Type 2 diabetes mellitus with hyperglycemia, without long-term current use of insulin (HCC) On ozempic and jardiance. -Increase ozempic for weight control to 9 clicks past 0.25 mg for 2 weeks and then increase to 0.5 mg after if tolerating well History of headache Headaches essentially gone now. Sleep impacted frequency from her diary per patient. -Monitor Gastroesophageal reflux disease, unspecified whether esophagitis present Resolved with famotidine. Will watch closely with increasing ozempic.   Patient request clindamycin pledgets for HS as it worked well for her daughter-can't find in system. Called her pharmacy ([facility]) and was able to verbally order these pledgets BID PRN to be applied on affected area  Time spent during visit with patient: 15 minutes

## 2023-06-19 NOTE — Patient Instructions (Signed)
It was great to see you! Thank you for allowing me to participate in your care!   Our plans for today:  - I will call about the clindamycin pledgets - Increase Ozempic 9 clicks over the .25 for 2 weeks and if tolerating can go to 0.5 - Continue famotidine  Take care and seek immediate care sooner if you develop any concerns.  Levin Erp, MD

## 2023-06-21 ENCOUNTER — Other Ambulatory Visit: Payer: Self-pay | Admitting: Student

## 2023-06-21 DIAGNOSIS — I10 Essential (primary) hypertension: Secondary | ICD-10-CM

## 2023-07-02 ENCOUNTER — Other Ambulatory Visit: Payer: Self-pay | Admitting: Student

## 2023-07-02 DIAGNOSIS — E1165 Type 2 diabetes mellitus with hyperglycemia: Secondary | ICD-10-CM

## 2023-07-03 ENCOUNTER — Other Ambulatory Visit (HOSPITAL_COMMUNITY): Payer: Self-pay

## 2023-07-03 MED ORDER — OZEMPIC (0.25 OR 0.5 MG/DOSE) 2 MG/3ML ~~LOC~~ SOPN
0.2500 mg | PEN_INJECTOR | SUBCUTANEOUS | 0 refills | Status: DC
  Filled 2023-07-03: qty 3, 56d supply, fill #0

## 2023-07-11 ENCOUNTER — Ambulatory Visit (HOSPITAL_BASED_OUTPATIENT_CLINIC_OR_DEPARTMENT_OTHER): Payer: BC Managed Care – PPO | Admitting: Obstetrics & Gynecology

## 2023-08-04 ENCOUNTER — Other Ambulatory Visit: Payer: Self-pay | Admitting: Student

## 2023-08-04 DIAGNOSIS — E1165 Type 2 diabetes mellitus with hyperglycemia: Secondary | ICD-10-CM

## 2023-08-26 ENCOUNTER — Other Ambulatory Visit: Payer: Self-pay | Admitting: Student

## 2023-08-26 DIAGNOSIS — E1165 Type 2 diabetes mellitus with hyperglycemia: Secondary | ICD-10-CM

## 2023-08-27 ENCOUNTER — Other Ambulatory Visit (HOSPITAL_COMMUNITY): Payer: Self-pay

## 2023-08-27 MED ORDER — OZEMPIC (0.25 OR 0.5 MG/DOSE) 2 MG/3ML ~~LOC~~ SOPN
0.2500 mg | PEN_INJECTOR | SUBCUTANEOUS | 0 refills | Status: DC
  Filled 2023-08-27: qty 3, 56d supply, fill #0

## 2023-08-28 ENCOUNTER — Other Ambulatory Visit (HOSPITAL_COMMUNITY): Payer: Self-pay

## 2023-09-03 ENCOUNTER — Ambulatory Visit: Admitting: Podiatry

## 2023-09-06 ENCOUNTER — Other Ambulatory Visit: Payer: Self-pay | Admitting: Student

## 2023-09-08 ENCOUNTER — Other Ambulatory Visit: Payer: Self-pay | Admitting: Student

## 2023-09-08 DIAGNOSIS — I10 Essential (primary) hypertension: Secondary | ICD-10-CM

## 2023-09-26 ENCOUNTER — Other Ambulatory Visit (HOSPITAL_COMMUNITY)
Admission: RE | Admit: 2023-09-26 | Discharge: 2023-09-26 | Disposition: A | Discharge status: Home / Self Care | Source: Ambulatory Visit | Attending: Obstetrics & Gynecology | Admitting: Obstetrics & Gynecology

## 2023-09-26 ENCOUNTER — Ambulatory Visit (INDEPENDENT_AMBULATORY_CARE_PROVIDER_SITE_OTHER): Payer: 59 | Admitting: Obstetrics & Gynecology

## 2023-09-26 ENCOUNTER — Encounter (HOSPITAL_BASED_OUTPATIENT_CLINIC_OR_DEPARTMENT_OTHER): Payer: Self-pay | Admitting: Obstetrics & Gynecology

## 2023-09-26 VITALS — BP 135/89 | HR 105 | Ht 63.0 in | Wt 301.0 lb

## 2023-09-26 DIAGNOSIS — E1165 Type 2 diabetes mellitus with hyperglycemia: Secondary | ICD-10-CM | POA: Diagnosis not present

## 2023-09-26 DIAGNOSIS — Z01419 Encounter for gynecological examination (general) (routine) without abnormal findings: Secondary | ICD-10-CM

## 2023-09-26 DIAGNOSIS — R87619 Unspecified abnormal cytological findings in specimens from cervix uteri: Secondary | ICD-10-CM

## 2023-09-26 DIAGNOSIS — Z124 Encounter for screening for malignant neoplasm of cervix: Secondary | ICD-10-CM | POA: Diagnosis present

## 2023-09-26 DIAGNOSIS — Z780 Asymptomatic menopausal state: Secondary | ICD-10-CM | POA: Diagnosis not present

## 2023-09-26 NOTE — Progress Notes (Unsigned)
 ANNUAL EXAM Patient name: Stacey Robbins MRN [id number]  Date of birth: 1971/04/29 Chief Complaint:   No chief complaint on file.  History of Present Illness:   Stacey Robbins is a 53 y.o. [phone] African-American female being seen today for a routine annual exam.     Has not had a menstrual cycle in about a year.  Having increased issues with hot flashes and night sweats.  Also she's felt more issues with mood swings.   Current complaints: ***  Patient's last menstrual period was 12/01/2020.   The pregnancy intention screening data noted above was reviewed. Potential methods of contraception were discussed. The patient elected to proceed with No data recorded.   Last pap  07/09/2022. Results were: {Pap findings:25134}. H/O abnormal pap: {yes/yes***/no:23866} Last mammogram: 04/19/2023 . Results were: {normal, abnormal, n/a:23837}. Family h/o breast cancer: {yes***/no:23838} Last colonoscopy: 06/28/2022. Results were: abnormal adenomatous polyps. Family h/o colorectal cancer: no     05/21/2023    1:41 PM 09/14/2022    3:16 PM 08/09/2022    9:40 AM 07/09/2022    9:28 AM 02/02/2022    9:39 AM  Depression screen PHQ 2/9  Decreased Interest 0 0 0 0 0  Down, Depressed, Hopeless 0 0 0 0 0  PHQ - 2 Score 0 0 0 0 0  Altered sleeping 1 0 1  0  Tired, decreased energy 1 0 0  0  Change in appetite 1 1 0  0  Feeling bad or failure about yourself  0 0 0  0  Trouble concentrating 0 0 0  0  Moving slowly or fidgety/restless 0 0 0  0  Suicidal thoughts 0 0 0  0  PHQ-9 Score 3 1 1   0  Difficult doing work/chores Not difficult at all            04/15/2020    4:02 PM 04/08/2020   11:48 AM  GAD 7 : Generalized Anxiety Score  Nervous, Anxious, on Edge 0 1  Control/stop worrying 0 1  Worry too much - different things 0 1  Trouble relaxing 0 1  Restless 0 1  Easily annoyed or irritable 0 1  Afraid - awful might happen 0 1  Total GAD 7 Score 0 7  Anxiety Difficulty Not difficult at all  Somewhat difficult     Review of Systems:   Pertinent items are noted in HPI Denies any headaches, blurred vision, fatigue, shortness of breath, chest pain, abdominal pain, abnormal vaginal discharge/itching/odor/irritation, problems with periods, bowel movements, urination, or intercourse unless otherwise stated above. Pertinent History Reviewed:  Reviewed past medical,surgical, social and family history.  Reviewed problem list, medications and allergies. Physical Assessment:  There were no vitals filed for this visit.There is no height or weight on file to calculate BMI.        Physical Examination:   General appearance - well appearing, and in no distress  Mental status - alert, oriented to person, place, and time  Psych:  She has a normal mood and affect  Skin - warm and dry, normal color, no suspicious lesions noted  Chest - effort normal, all lung fields clear to auscultation bilaterally  Heart - normal rate and regular rhythm  Neck:  midline trachea, no thyromegaly or nodules  Breasts - breasts appear normal, no suspicious masses, no skin or nipple changes or  axillary nodes  Abdomen - soft, nontender, nondistended, no masses or organomegaly  Pelvic - VULVA: normal appearing vulva with no  masses, tenderness or lesions  VAGINA: normal appearing vagina with normal color and discharge, no lesions  CERVIX: normal appearing cervix without discharge or lesions, no CMT  Thin prep pap is {Desc; done/not:10129} *** HR HPV cotesting  UTERUS: uterus is felt to be normal size, shape, consistency and nontender   ADNEXA: No adnexal masses or tenderness noted.  Rectal - normal rectal, good sphincter tone, no masses felt. Hemoccult: ***  Extremities:  No swelling or varicosities noted  Chaperone present for exam  No results found for this or any previous visit (from the past 24 hours).  Assessment & Plan:  1) Well-Woman Exam  2) ***  Labs/procedures today: ***  Mammogram: {Mammo  f/u:25212::"@ 51yo"}, or sooner if problems Colonoscopy: {TCS f/u:25213::"@ 53yo"}, or sooner if problems  No orders of the defined types were placed in this encounter.   Meds: No orders of the defined types were placed in this encounter.   Follow-up: No follow-ups on file.  Stacey  Robbins, CMA 09/26/2023 10:26 AM

## 2023-09-29 ENCOUNTER — Encounter (HOSPITAL_BASED_OUTPATIENT_CLINIC_OR_DEPARTMENT_OTHER): Payer: Self-pay | Admitting: Obstetrics & Gynecology

## 2023-10-02 LAB — CYTOLOGY - PAP
Adequacy: ABSENT
Comment: NEGATIVE
Diagnosis: NEGATIVE
Diagnosis: REACTIVE
High risk HPV: NEGATIVE

## 2023-10-03 ENCOUNTER — Ambulatory Visit (HOSPITAL_BASED_OUTPATIENT_CLINIC_OR_DEPARTMENT_OTHER): Payer: Self-pay | Admitting: Obstetrics & Gynecology

## 2023-10-03 ENCOUNTER — Other Ambulatory Visit (HOSPITAL_BASED_OUTPATIENT_CLINIC_OR_DEPARTMENT_OTHER): Payer: Self-pay

## 2023-10-03 DIAGNOSIS — B9689 Other specified bacterial agents as the cause of diseases classified elsewhere: Secondary | ICD-10-CM

## 2023-10-03 MED ORDER — METRONIDAZOLE 500 MG PO TABS: 500.0000 mg | ORAL_TABLET | Freq: Two times a day (BID) | ORAL | 0 refills | Status: DC

## 2023-10-15 ENCOUNTER — Encounter: Payer: Self-pay | Admitting: *Deleted

## 2023-10-15 ENCOUNTER — Ambulatory Visit: Admitting: Student

## 2023-10-17 ENCOUNTER — Other Ambulatory Visit: Payer: Self-pay | Admitting: Student

## 2023-10-17 ENCOUNTER — Other Ambulatory Visit (HOSPITAL_COMMUNITY): Payer: Self-pay

## 2023-10-17 ENCOUNTER — Ambulatory Visit (INDEPENDENT_AMBULATORY_CARE_PROVIDER_SITE_OTHER): Admitting: Family Medicine

## 2023-10-17 VITALS — BP 130/72 | HR 106 | Ht 63.0 in | Wt 307.2 lb

## 2023-10-17 DIAGNOSIS — R42 Dizziness and giddiness: Secondary | ICD-10-CM | POA: Diagnosis not present

## 2023-10-17 DIAGNOSIS — E1165 Type 2 diabetes mellitus with hyperglycemia: Secondary | ICD-10-CM

## 2023-10-17 MED ORDER — OZEMPIC (0.25 OR 0.5 MG/DOSE) 2 MG/3ML ~~LOC~~ SOPN
0.2500 mg | PEN_INJECTOR | SUBCUTANEOUS | 0 refills | Status: AC
  Filled 2023-10-17: qty 3, 56d supply, fill #0

## 2023-10-17 NOTE — Patient Instructions (Addendum)
 Dear Stacey Robbins  Today we discussed the following concerns and plans:  Dizziness - I think this is most likely related to your blood pressure. - STOP taking spironolactone  for now. Continue all of your other medications as prescribed. - I will also check some labs today.  Please follow up in 2 weeks so we can check your blood pressure and see if your dizziness has improved.  If you have any concerns, please call the clinic or schedule an appointment.  It was a pleasure to take care of you today. Be well!  Omar Bibber, DO [facility], PGY-1

## 2023-10-17 NOTE — Progress Notes (Signed)
    SUBJECTIVE:   CHIEF COMPLAINT / HPI:   Dizziness Started a few weeks ago. Occurs both when sitting and when transitioning to stand, but notices it most when she gets up to go to the bathroom at night. About 4 days a week. Sometimes feels like room is spinning. Lasts 10 seconds. No syncope. Occurs sometimes with nausea, no vomiting. Concerned ozempic  and Jardiance  are dropping her blood sugar and causing symptoms - these are not new medicines.  PERTINENT  PMH / PSH: Reviewed. HTN, T2DM, Anxiety  OBJECTIVE:   BP 130/72   Pulse (!) 106   Ht 5' 3 (1.6 m)   Wt (!) 307 lb 3.2 oz (139.3 kg)   LMP 12/01/2020 Comment: tubes tied  SpO2 95%   BMI 54.42 kg/m    Orthostatic vital signs negative.  General: Well-appearing, pleasant, no acute distress. HEENT: normocephalic, EOM grossly intact. No nystagmus. Cardio: Regular rate, regular rhythm, no murmurs on exam. Pulm: Clear, no wheezing, no crackles. No increased work of breathing. Extremities: no peripheral edema. Moves all extremities equally. Neuro: Alert and oriented x3, speech normal in content, no facial asymmetry.  ASSESSMENT/PLAN:   Assessment & Plan Dizziness Symptoms not suggestive of vertigo and orthostatic VS are negative. However, pressures overall are quite low with repeat DBPs ranging 45-50. Suspect this is the cause of her symptoms. - will hold spironolactone  for now - CBC, BMP today - follow up in 2 weeks to check BP and assess dizziness    Omar Bibber, DO [facility]

## 2023-10-18 LAB — BASIC METABOLIC PANEL WITH GFR
BUN/Creatinine Ratio: 15 (ref 9–23)
BUN: 12 mg/dL (ref 6–24)
CO2: 24 mmol/L (ref 20–29)
Calcium: 10 mg/dL (ref 8.7–10.2)
Chloride: 101 mmol/L (ref 96–106)
Creatinine, Ser: 0.79 mg/dL (ref 0.57–1.00)
Glucose: 103 mg/dL — ABNORMAL HIGH (ref 70–99)
Potassium: 4.3 mmol/L (ref 3.5–5.2)
Sodium: 140 mmol/L (ref 134–144)
eGFR: 91 mL/min/{1.73_m2} (ref >59)

## 2023-10-18 LAB — CBC
Hematocrit: 44.2 % (ref 34.0–46.6)
Hemoglobin: 13.6 g/dL (ref 11.1–15.9)
MCH: 27.2 pg (ref 26.6–33.0)
MCHC: 30.8 g/dL — ABNORMAL LOW (ref 31.5–35.7)
MCV: 88 fL (ref 79–97)
Platelets: 291 10*3/uL (ref 150–450)
RBC: 5 x10E6/uL (ref 3.77–5.28)
RDW: 13.2 % (ref 11.7–15.4)
WBC: 5.3 10*3/uL (ref 3.4–10.8)

## 2023-10-21 ENCOUNTER — Ambulatory Visit: Payer: Self-pay | Admitting: Family Medicine

## 2023-10-31 ENCOUNTER — Other Ambulatory Visit: Payer: Self-pay | Admitting: Student

## 2023-10-31 DIAGNOSIS — Z794 Long term (current) use of insulin: Secondary | ICD-10-CM

## 2023-10-31 DIAGNOSIS — L732 Hidradenitis suppurativa: Secondary | ICD-10-CM

## 2023-11-04 ENCOUNTER — Encounter: Payer: Self-pay | Admitting: Family Medicine

## 2023-11-04 ENCOUNTER — Ambulatory Visit (INDEPENDENT_AMBULATORY_CARE_PROVIDER_SITE_OTHER): Admitting: Family Medicine

## 2023-11-04 VITALS — BP 135/86 | HR 99 | Ht 63.0 in | Wt 308.0 lb

## 2023-11-04 DIAGNOSIS — I10 Essential (primary) hypertension: Secondary | ICD-10-CM | POA: Diagnosis not present

## 2023-11-04 DIAGNOSIS — F419 Anxiety disorder, unspecified: Secondary | ICD-10-CM | POA: Diagnosis not present

## 2023-11-04 MED ORDER — FLUOXETINE HCL 20 MG PO TABS: 20.0000 mg | ORAL_TABLET | Freq: Every day | ORAL | 1 refills | Status: DC

## 2023-11-04 NOTE — Assessment & Plan Note (Signed)
 Patient would like to start a daily medication to help with her anxiety and mood. - Start fluoxetine 20 mg daily and follow-up in 4 to 6 weeks to reevaluate; patient will follow-up sooner if mood/anxiety worsens or she has any other concerns. -May consider fluoxetine dose increase in the future as needed; could also consider adding PRN anxiolytics.

## 2023-11-04 NOTE — Assessment & Plan Note (Signed)
 Well-controlled even after discontinuing spironolactone .  Dizzy episodes have also resolved. - Continue HCTZ 25 mg daily, losartan  100 mg daily

## 2023-11-04 NOTE — Progress Notes (Signed)
    SUBJECTIVE:   CHIEF COMPLAINT / HPI:   Follow-up dizziness Patient was seen by me 6/12 with concern for dizziness.  In the office it was noted that she had very low diastolic pressures; discontinued spironolactone  as I suspected over-control of BP was culprit for her dizziness.  Lab work also collected which did not show any cause for concern/explanation for her symptoms. Today in the office she tells me that her dizziness has improved - she has only had one very brief episode since discontinuing spironolactone . She has continued to take her other BP medications, HCTZ 25 mg daily, losartan  100 mg daily.  Anxiety Flutters in chest, panicky, totally random and not linked to events or time of day. She has experienced anxiety frequently in her life, though it is flaring up again lately. According to chart review she was previously on Zoloft ; she cannot remember which she has taken in the past, but agrees that it was not very helpful.  She is also concerned about medications causing weight gain. She denies SI/HI.  PERTINENT  PMH / PSH: Reviewed  OBJECTIVE:   BP 135/86   Pulse 99   Ht 5' 3 (1.6 m)   Wt (!) 308 lb (139.7 kg)   LMP 12/01/2020 Comment: tubes tied  SpO2 99%   BMI 54.56 kg/m   General: Well-appearing, no acute distress. HEENT: normocephalic, PERRLA, EOM grossly intact, MMM. Cardio: Regular rate, regular rhythm, no murmurs on exam. Pulm: Clear, no wheezing, no crackles. No increased work of breathing. Extremities: no peripheral edema. Moves all extremities equally. Neuro: Alert and oriented x3, speech normal in content, no facial asymmetry. Psych:  Cognition and judgment appear intact. Alert, communicative, and cooperative.   ASSESSMENT/PLAN:   Assessment & Plan Essential hypertension, benign Well-controlled even after discontinuing spironolactone .  Dizzy episodes have also resolved. - Continue HCTZ 25 mg daily, losartan  100 mg daily Anxiety Patient would like  to start a daily medication to help with her anxiety and mood. - Start fluoxetine 20 mg daily and follow-up in 4 to 6 weeks to reevaluate; patient will follow-up sooner if mood/anxiety worsens or she has any other concerns. -May consider fluoxetine dose increase in the future as needed; could also consider adding PRN anxiolytics.    Lauraine Norse, DO [facility]

## 2023-11-04 NOTE — Patient Instructions (Signed)
 It was so good to see you today! Thank you for allowing me to take care of you.  Today we discussed the following concerns and plans:  Blood pressure - Blood pressure looks great -keep up the good work!  Anxiety - I recommend starting fluoxetine 20 mg daily; I have sent this into your pharmacy. - Follow-up in 6 weeks so we can see how the medicine is working for you and make any changes if necessary. - For information on therapists, please go to [email or web address]. You can also contact your insurance company to find an in-network therapist.   If you have any concerns, please call the clinic or schedule an appointment.  It was a pleasure to take care of you today. Be well!  Lauraine Norse, DO [facility], PGY-1   Don't forget to check out the [facility] in [facility] at [address] [phone] Affordable prices on prescriptions and over-the-counter items, as well as services like vaccinations and medication home delivery.

## 2023-11-18 ENCOUNTER — Other Ambulatory Visit: Payer: Self-pay

## 2023-11-18 DIAGNOSIS — K219 Gastro-esophageal reflux disease without esophagitis: Secondary | ICD-10-CM

## 2023-11-19 ENCOUNTER — Telehealth: Payer: Self-pay

## 2023-11-19 NOTE — Telephone Encounter (Signed)
 Waiting for doctor to refill medication.

## 2023-11-19 NOTE — Telephone Encounter (Signed)
 error

## 2023-11-20 MED ORDER — FAMOTIDINE 40 MG PO TABS: 40.0000 mg | ORAL_TABLET | Freq: Every day | ORAL | 1 refills | Status: AC

## 2023-11-25 ENCOUNTER — Other Ambulatory Visit: Payer: Self-pay

## 2023-12-02 ENCOUNTER — Other Ambulatory Visit (HOSPITAL_COMMUNITY): Payer: Self-pay

## 2023-12-09 ENCOUNTER — Other Ambulatory Visit: Payer: Self-pay

## 2023-12-11 MED ORDER — ATORVASTATIN CALCIUM 40 MG PO TABS: 40.0000 mg | ORAL_TABLET | Freq: Every day | ORAL | 0 refills | Status: AC

## 2023-12-17 ENCOUNTER — Encounter: Payer: Self-pay | Admitting: Family Medicine

## 2024-02-21 ENCOUNTER — Other Ambulatory Visit (HOSPITAL_COMMUNITY): Payer: Self-pay

## 2024-02-21 MED ORDER — MOUNJARO 2.5 MG/0.5ML ~~LOC~~ SOAJ
2.5000 mg | SUBCUTANEOUS | 3 refills | Status: DC
  Filled 2024-02-21: qty 2, 28d supply, fill #0
  Filled 2024-03-23: qty 2, 28d supply, fill #1
  Filled 2024-04-21 – 2024-04-23 (×3): qty 2, 28d supply, fill #2
  Filled 2024-05-16: qty 2, 28d supply, fill #3

## 2024-03-07 ENCOUNTER — Other Ambulatory Visit: Payer: Self-pay

## 2024-04-10 ENCOUNTER — Other Ambulatory Visit: Payer: Self-pay | Admitting: Family Medicine

## 2024-04-10 DIAGNOSIS — Z1231 Encounter for screening mammogram for malignant neoplasm of breast: Secondary | ICD-10-CM

## 2024-04-13 ENCOUNTER — Other Ambulatory Visit: Payer: Self-pay

## 2024-04-13 ENCOUNTER — Other Ambulatory Visit: Payer: Self-pay | Admitting: Physician Assistant

## 2024-04-13 DIAGNOSIS — Z1231 Encounter for screening mammogram for malignant neoplasm of breast: Secondary | ICD-10-CM

## 2024-04-23 ENCOUNTER — Other Ambulatory Visit (HOSPITAL_COMMUNITY): Payer: Self-pay

## 2024-05-06 ENCOUNTER — Other Ambulatory Visit: Payer: Self-pay | Admitting: Family Medicine

## 2024-05-06 ENCOUNTER — Ambulatory Visit
Admission: RE | Admit: 2024-05-06 | Discharge: 2024-05-06 | Disposition: A | Discharge status: Home / Self Care | Source: Ambulatory Visit

## 2024-05-06 DIAGNOSIS — Z1231 Encounter for screening mammogram for malignant neoplasm of breast: Secondary | ICD-10-CM

## 2024-05-20 ENCOUNTER — Other Ambulatory Visit (HOSPITAL_COMMUNITY): Payer: Self-pay

## 2024-05-21 ENCOUNTER — Encounter (HOSPITAL_BASED_OUTPATIENT_CLINIC_OR_DEPARTMENT_OTHER): Payer: Self-pay | Admitting: Obstetrics & Gynecology

## 2024-05-22 NOTE — Progress Notes (Unsigned)
" ° °  GYNECOLOGY  VISIT  CC:   bleeding, recurrent infection   HPI: 54 y.o. [id number] Divorced Black or African American female here for post-menopausal bleeding. She does report that she has some a boil on her vagina that occurs every month or so. She reports that she is not sure if the bleeding is coming from the boils. She reports that when she does have the bleeding it is only slight spotting. She reports that the boils are painful. She uses clindamycin wipes on the boils to help clean the area and has bought some over the counter ointment to help pop them. She reports that she sometimes gets multiple at a time and other times it is only one at a time.  Has two areas that are particularly problematic.  H/o hidradenitis.  Pt does occasionally have axillary lesions as well.  Was on doxycyline until current provider would not continue rx for her.  Since then, has been much more symptomatic.    Patient's last menstrual period was 12/01/2020.  Last pap 09/26/2023.    Past Medical History:  Diagnosis Date   Diabetes type 2, controlled (HCC)    History of gestational diabetes    Hypertension    Preeclampsia     MEDS:  Reviewed in EPIC  ALLERGIES: Amlodipine , Lisinopril , and Shrimp [shellfish allergy]  SH:  divorced, non smoker  Review of Systems  Constitutional: Negative.   Skin:        Vulvar skin boils    PHYSICAL EXAMINATION:    BP 133/72 (BP Location: Left Arm, Patient Position: Sitting, Cuff Size: Normal)   Pulse 97   Ht 5' 3 (1.6 m)   Wt (!) 311 lb (141.1 kg)   LMP 12/01/2020 Comment: tubes tied  SpO2 99%   BMI 55.09 kg/m     General appearance: alert, cooperative and appears stated age Lymph:  no inguinal LAD noted  Pelvic: External genitalia:  furuncle on left vulva, inferior to labia majora and second furuncle on right outer labia major.  No sinus formations              Urethra:  normal appearing urethra with no masses, tenderness or lesions              Bartholins and  Skenes: normal                 Vagina: normal mucosa without prolapse or lesions               Chaperone was present for exam.  Assessment/Plan: 1. Hidradenitis suppurativa (Primary) - will restart doxycycline  100mg  bid x 7 days and then daily - recheck about 1 month  2. Vulvar furuncle  3. Postmenopausal bleeding - I do think the bleeding she is seeing is from her cervix but will check PUS to ensure thin endometrium - US  PELVIS TRANSVAGINAL NON-OB (TV ONLY); Future   "

## 2024-05-24 ENCOUNTER — Other Ambulatory Visit: Payer: Self-pay

## 2024-05-24 DIAGNOSIS — K219 Gastro-esophageal reflux disease without esophagitis: Secondary | ICD-10-CM

## 2024-05-25 ENCOUNTER — Ambulatory Visit (HOSPITAL_BASED_OUTPATIENT_CLINIC_OR_DEPARTMENT_OTHER): Admitting: Obstetrics & Gynecology

## 2024-05-25 ENCOUNTER — Encounter (HOSPITAL_BASED_OUTPATIENT_CLINIC_OR_DEPARTMENT_OTHER): Payer: Self-pay | Admitting: Obstetrics & Gynecology

## 2024-05-25 VITALS — BP 133/72 | HR 97 | Ht 63.0 in | Wt 311.0 lb

## 2024-05-25 DIAGNOSIS — N950 Postmenopausal bleeding: Secondary | ICD-10-CM | POA: Diagnosis not present

## 2024-05-25 DIAGNOSIS — N764 Abscess of vulva: Secondary | ICD-10-CM

## 2024-05-25 DIAGNOSIS — L732 Hidradenitis suppurativa: Secondary | ICD-10-CM

## 2024-05-25 MED ORDER — DOXYCYCLINE HYCLATE 100 MG PO CAPS: 100.0000 mg | ORAL_CAPSULE | Freq: Two times a day (BID) | ORAL | 0 refills | Status: DC

## 2024-05-26 ENCOUNTER — Other Ambulatory Visit (HOSPITAL_COMMUNITY): Payer: Self-pay

## 2024-05-26 MED ORDER — MOUNJARO 5 MG/0.5ML ~~LOC~~ SOAJ
5.0000 mg | SUBCUTANEOUS | 3 refills | Status: AC
  Filled 2024-05-26 – 2024-05-28 (×2): qty 2, 28d supply, fill #0

## 2024-05-27 ENCOUNTER — Other Ambulatory Visit (HOSPITAL_BASED_OUTPATIENT_CLINIC_OR_DEPARTMENT_OTHER): Payer: Self-pay

## 2024-05-27 DIAGNOSIS — L732 Hidradenitis suppurativa: Secondary | ICD-10-CM

## 2024-05-27 MED ORDER — DOXYCYCLINE HYCLATE 100 MG PO CAPS: 100.0000 mg | ORAL_CAPSULE | Freq: Every day | ORAL | 0 refills | Status: AC

## 2024-05-28 ENCOUNTER — Ambulatory Visit (HOSPITAL_BASED_OUTPATIENT_CLINIC_OR_DEPARTMENT_OTHER): Payer: Self-pay | Admitting: Obstetrics & Gynecology

## 2024-05-28 ENCOUNTER — Other Ambulatory Visit (HOSPITAL_COMMUNITY): Payer: Self-pay

## 2024-06-24 ENCOUNTER — Other Ambulatory Visit (HOSPITAL_BASED_OUTPATIENT_CLINIC_OR_DEPARTMENT_OTHER)

## 2024-06-24 ENCOUNTER — Ambulatory Visit (HOSPITAL_BASED_OUTPATIENT_CLINIC_OR_DEPARTMENT_OTHER): Payer: Self-pay | Admitting: Obstetrics & Gynecology
